# Patient Record
Sex: Male | Born: 1945 | Race: White | Hispanic: No | Marital: Married | Smoking: Never smoker
Health system: Southern US, Community
[De-identification: ages and names within clinical notes are randomized; demographics above are authoritative.]

## PROBLEM LIST (undated history)

## (undated) DIAGNOSIS — C801 Malignant (primary) neoplasm, unspecified: Secondary | ICD-10-CM

## (undated) DIAGNOSIS — E7800 Pure hypercholesterolemia, unspecified: Secondary | ICD-10-CM

## (undated) DIAGNOSIS — K219 Gastro-esophageal reflux disease without esophagitis: Secondary | ICD-10-CM

## (undated) DIAGNOSIS — I10 Essential (primary) hypertension: Secondary | ICD-10-CM

## (undated) DIAGNOSIS — I2510 Atherosclerotic heart disease of native coronary artery without angina pectoris: Secondary | ICD-10-CM

## (undated) HISTORY — PX: CORONARY ANGIOPLASTY WITH STENT PLACEMENT: SHX49

## (undated) HISTORY — PX: HERNIA REPAIR: SHX51

---

## 2012-12-07 ENCOUNTER — Emergency Department (HOSPITAL_BASED_OUTPATIENT_CLINIC_OR_DEPARTMENT_OTHER): Payer: Medicare Other

## 2012-12-07 ENCOUNTER — Emergency Department (HOSPITAL_BASED_OUTPATIENT_CLINIC_OR_DEPARTMENT_OTHER)
Admission: EM | Admit: 2012-12-07 | Discharge: 2012-12-07 | Disposition: A | Discharge status: Home / Self Care | Payer: Medicare Other | Attending: Emergency Medicine | Admitting: Emergency Medicine

## 2012-12-07 ENCOUNTER — Encounter (HOSPITAL_BASED_OUTPATIENT_CLINIC_OR_DEPARTMENT_OTHER): Payer: Self-pay | Admitting: Emergency Medicine

## 2012-12-07 DIAGNOSIS — S93609A Unspecified sprain of unspecified foot, initial encounter: Secondary | ICD-10-CM | POA: Insufficient documentation

## 2012-12-07 DIAGNOSIS — K219 Gastro-esophageal reflux disease without esophagitis: Secondary | ICD-10-CM | POA: Insufficient documentation

## 2012-12-07 DIAGNOSIS — S93602A Unspecified sprain of left foot, initial encounter: Secondary | ICD-10-CM

## 2012-12-07 DIAGNOSIS — Y929 Unspecified place or not applicable: Secondary | ICD-10-CM | POA: Insufficient documentation

## 2012-12-07 DIAGNOSIS — I10 Essential (primary) hypertension: Secondary | ICD-10-CM | POA: Insufficient documentation

## 2012-12-07 DIAGNOSIS — X500XXA Overexertion from strenuous movement or load, initial encounter: Secondary | ICD-10-CM | POA: Insufficient documentation

## 2012-12-07 DIAGNOSIS — Z79899 Other long term (current) drug therapy: Secondary | ICD-10-CM | POA: Insufficient documentation

## 2012-12-07 DIAGNOSIS — Z7982 Long term (current) use of aspirin: Secondary | ICD-10-CM | POA: Insufficient documentation

## 2012-12-07 DIAGNOSIS — Y9301 Activity, walking, marching and hiking: Secondary | ICD-10-CM | POA: Insufficient documentation

## 2012-12-07 DIAGNOSIS — Z859 Personal history of malignant neoplasm, unspecified: Secondary | ICD-10-CM | POA: Insufficient documentation

## 2012-12-07 DIAGNOSIS — I2510 Atherosclerotic heart disease of native coronary artery without angina pectoris: Secondary | ICD-10-CM | POA: Insufficient documentation

## 2012-12-07 DIAGNOSIS — Z9861 Coronary angioplasty status: Secondary | ICD-10-CM | POA: Insufficient documentation

## 2012-12-07 DIAGNOSIS — E7800 Pure hypercholesterolemia, unspecified: Secondary | ICD-10-CM | POA: Insufficient documentation

## 2012-12-07 HISTORY — DX: Malignant (primary) neoplasm, unspecified: C80.1

## 2012-12-07 HISTORY — DX: Pure hypercholesterolemia, unspecified: E78.00

## 2012-12-07 HISTORY — DX: Atherosclerotic heart disease of native coronary artery without angina pectoris: I25.10

## 2012-12-07 HISTORY — DX: Essential (primary) hypertension: I10

## 2012-12-07 HISTORY — DX: Gastro-esophageal reflux disease without esophagitis: K21.9

## 2012-12-07 NOTE — ED Provider Notes (Signed)
CSN: [id number]     Arrival date & time 12/07/12  1457 History   First MD Initiated Contact with Patient 12/07/12 1606     Chief Complaint  Patient presents with  . Foot Injury    HPI Patient presents to emergency room with complaints of left foot pain. He was walking yesterday and stepped on something awkwardly. he had mild discomfort initially but over the last day he has had increasing pain.  The pain is mild to moderate. She tried taking an anti-inflammatory medication with some relief. The pain is better when he is resting. He has not noticed any swelling or bruising. He denies any other injuries or complaints. Past Medical History  Diagnosis Date  . High cholesterol   . GERD (gastroesophageal reflux disease)   . Hypertension   . Coronary artery disease   . Cancer    Past Surgical History  Procedure Laterality Date  . Coronary angioplasty with stent placement    . Hernia repair     No family history on file. History  Substance Use Topics  . Smoking status: Never Smoker   . Smokeless tobacco: Not on file  . Alcohol Use: No    Review of Systems  Constitutional: Negative for fever.  Musculoskeletal: Negative for joint swelling and myalgias.  Neurological: Negative for numbness.    Allergies  Review of patient's allergies indicates no known allergies.  Home Medications   Current Outpatient Rx  Name  Route  Sig  Dispense  Refill  . aspirin 81 MG tablet   Oral   Take 81 mg by mouth daily.         . Metoprolol Tartrate (LOPRESSOR PO)   Oral   Take by mouth.         . nitroGLYCERIN (NITROSTAT) 0.3 MG SL tablet   Sublingual   Place 0.3 mg under the tongue every 5 (five) minutes as needed for chest pain.         Marland Kitchen Omeprazole (PRILOSEC PO)   Oral   Take by mouth.         . Rosuvastatin Calcium (CRESTOR PO)   Oral   Take by mouth.          BP 106/62  Pulse 66  Temp(Src) 98.2 F (36.8 C) (Oral)  Resp 16  Ht 5\' 10"  (1.778 m)  Wt 190 lb (86.183  kg)  BMI 27.26 kg/m2  SpO2 100% Physical Exam  Nursing note and vitals reviewed. Constitutional: He appears well-developed and well-nourished. No distress.  HENT:  Head: Normocephalic and atraumatic.  Right Ear: External ear normal.  Left Ear: External ear normal.  Eyes: Conjunctivae are normal. Right eye exhibits no discharge. Left eye exhibits no discharge. No scleral icterus.  Neck: Neck supple. No tracheal deviation present.  Cardiovascular: Normal rate.   Pulmonary/Chest: Effort normal. No stridor. No respiratory distress.  Musculoskeletal: He exhibits no edema.       Left ankle: Normal. No tenderness.       Left foot: He exhibits bony tenderness. He exhibits no swelling, no crepitus and no deformity.   tenderness palpation left fifth metatarsal region, no edema, no ecchymoses, neurovascular intact  Neurological: He is alert. Cranial nerve deficit: no gross deficits.  Skin: Skin is warm and dry. No rash noted.  Psychiatric: He has a normal mood and affect.    ED Course  Procedures (including critical care time) Labs Review Labs Reviewed - No data to display Imaging Review Dg Foot Complete Left  12/07/2012   CLINICAL DATA:  Twisted foot. Pain along lateral foot near the base of 5th metatarsal.  EXAM: LEFT FOOT - COMPLETE 3+ VIEW  COMPARISON:  None.  FINDINGS: There is no evidence of fracture or dislocation. There is joint space loss and spurring along the lateral side of the 1st metatarsophalangeal joint. Peripheral vascular calcifications are noted anterior to the ankle joint. Soft tissues are unremarkable.  IMPRESSION: No acute bony abnormality.   Electronically Signed   By: Britta Mccreedy M.D.   On: 12/07/2012 15:46    EKG Interpretation   None       MDM   1. Foot sprain, left, initial encounter    Most likely a mild foot sprain. Rest ice elevate. Follow up with his primary Dr. as needed. Patient did not feel that he needed any crutches    Celene Kras,  MD 12/07/12 [id number]

## 2012-12-07 NOTE — ED Notes (Signed)
Left foot pain while walking yesterday.

## 2015-02-02 DIAGNOSIS — J029 Acute pharyngitis, unspecified: Secondary | ICD-10-CM | POA: Diagnosis not present

## 2015-02-02 DIAGNOSIS — R0982 Postnasal drip: Secondary | ICD-10-CM | POA: Diagnosis not present

## 2015-02-02 DIAGNOSIS — J0140 Acute pansinusitis, unspecified: Secondary | ICD-10-CM | POA: Diagnosis not present

## 2015-02-06 DIAGNOSIS — G4733 Obstructive sleep apnea (adult) (pediatric): Secondary | ICD-10-CM | POA: Diagnosis not present

## 2015-03-12 DIAGNOSIS — J0140 Acute pansinusitis, unspecified: Secondary | ICD-10-CM | POA: Diagnosis not present

## 2015-03-22 DIAGNOSIS — I2510 Atherosclerotic heart disease of native coronary artery without angina pectoris: Secondary | ICD-10-CM | POA: Diagnosis not present

## 2015-03-22 DIAGNOSIS — Z9861 Coronary angioplasty status: Secondary | ICD-10-CM | POA: Diagnosis not present

## 2015-03-22 DIAGNOSIS — R0609 Other forms of dyspnea: Secondary | ICD-10-CM | POA: Diagnosis not present

## 2015-03-22 DIAGNOSIS — R0789 Other chest pain: Secondary | ICD-10-CM | POA: Diagnosis not present

## 2015-03-22 DIAGNOSIS — E785 Hyperlipidemia, unspecified: Secondary | ICD-10-CM | POA: Diagnosis not present

## 2015-03-26 DIAGNOSIS — Z01818 Encounter for other preprocedural examination: Secondary | ICD-10-CM | POA: Diagnosis not present

## 2015-03-28 DIAGNOSIS — I25118 Atherosclerotic heart disease of native coronary artery with other forms of angina pectoris: Secondary | ICD-10-CM | POA: Diagnosis not present

## 2015-03-28 DIAGNOSIS — I25119 Atherosclerotic heart disease of native coronary artery with unspecified angina pectoris: Secondary | ICD-10-CM | POA: Diagnosis not present

## 2015-03-28 DIAGNOSIS — E785 Hyperlipidemia, unspecified: Secondary | ICD-10-CM | POA: Diagnosis not present

## 2015-03-28 DIAGNOSIS — R0602 Shortness of breath: Secondary | ICD-10-CM | POA: Diagnosis not present

## 2015-03-28 DIAGNOSIS — Z9861 Coronary angioplasty status: Secondary | ICD-10-CM | POA: Diagnosis not present

## 2015-03-28 DIAGNOSIS — R0789 Other chest pain: Secondary | ICD-10-CM | POA: Diagnosis not present

## 2015-03-28 DIAGNOSIS — Z79899 Other long term (current) drug therapy: Secondary | ICD-10-CM | POA: Diagnosis not present

## 2015-03-28 DIAGNOSIS — I351 Nonrheumatic aortic (valve) insufficiency: Secondary | ICD-10-CM | POA: Diagnosis not present

## 2015-03-28 DIAGNOSIS — R0609 Other forms of dyspnea: Secondary | ICD-10-CM | POA: Diagnosis not present

## 2015-03-28 DIAGNOSIS — Q251 Coarctation of aorta: Secondary | ICD-10-CM | POA: Diagnosis not present

## 2015-04-26 DIAGNOSIS — E785 Hyperlipidemia, unspecified: Secondary | ICD-10-CM | POA: Diagnosis not present

## 2015-04-26 DIAGNOSIS — I2510 Atherosclerotic heart disease of native coronary artery without angina pectoris: Secondary | ICD-10-CM | POA: Diagnosis not present

## 2015-04-26 DIAGNOSIS — R0609 Other forms of dyspnea: Secondary | ICD-10-CM | POA: Diagnosis not present

## 2015-07-11 DIAGNOSIS — J0140 Acute pansinusitis, unspecified: Secondary | ICD-10-CM | POA: Diagnosis not present

## 2015-09-26 DIAGNOSIS — L570 Actinic keratosis: Secondary | ICD-10-CM | POA: Diagnosis not present

## 2015-09-26 DIAGNOSIS — L820 Inflamed seborrheic keratosis: Secondary | ICD-10-CM | POA: Diagnosis not present

## 2015-10-22 DIAGNOSIS — Z23 Encounter for immunization: Secondary | ICD-10-CM | POA: Diagnosis not present

## 2015-12-04 DIAGNOSIS — Z7982 Long term (current) use of aspirin: Secondary | ICD-10-CM | POA: Diagnosis not present

## 2015-12-04 DIAGNOSIS — C9241 Acute promyelocytic leukemia, in remission: Secondary | ICD-10-CM | POA: Diagnosis not present

## 2015-12-04 DIAGNOSIS — K219 Gastro-esophageal reflux disease without esophagitis: Secondary | ICD-10-CM | POA: Diagnosis not present

## 2015-12-04 DIAGNOSIS — E785 Hyperlipidemia, unspecified: Secondary | ICD-10-CM | POA: Diagnosis not present

## 2015-12-04 DIAGNOSIS — I2510 Atherosclerotic heart disease of native coronary artery without angina pectoris: Secondary | ICD-10-CM | POA: Diagnosis not present

## 2015-12-04 DIAGNOSIS — N400 Enlarged prostate without lower urinary tract symptoms: Secondary | ICD-10-CM | POA: Diagnosis not present

## 2015-12-04 DIAGNOSIS — Z79899 Other long term (current) drug therapy: Secondary | ICD-10-CM | POA: Diagnosis not present

## 2015-12-06 DIAGNOSIS — N400 Enlarged prostate without lower urinary tract symptoms: Secondary | ICD-10-CM | POA: Diagnosis not present

## 2015-12-13 DIAGNOSIS — N401 Enlarged prostate with lower urinary tract symptoms: Secondary | ICD-10-CM | POA: Diagnosis not present

## 2015-12-13 DIAGNOSIS — N138 Other obstructive and reflux uropathy: Secondary | ICD-10-CM | POA: Diagnosis not present

## 2015-12-13 DIAGNOSIS — R972 Elevated prostate specific antigen [PSA]: Secondary | ICD-10-CM | POA: Diagnosis not present

## 2016-01-11 DIAGNOSIS — M10062 Idiopathic gout, left knee: Secondary | ICD-10-CM | POA: Diagnosis not present

## 2016-03-03 DIAGNOSIS — M19079 Primary osteoarthritis, unspecified ankle and foot: Secondary | ICD-10-CM | POA: Diagnosis not present

## 2016-03-03 DIAGNOSIS — M7121 Synovial cyst of popliteal space [Baker], right knee: Secondary | ICD-10-CM | POA: Diagnosis not present

## 2016-03-03 DIAGNOSIS — M150 Primary generalized (osteo)arthritis: Secondary | ICD-10-CM | POA: Diagnosis not present

## 2016-03-04 DIAGNOSIS — M25562 Pain in left knee: Secondary | ICD-10-CM | POA: Diagnosis not present

## 2016-03-04 DIAGNOSIS — M25561 Pain in right knee: Secondary | ICD-10-CM | POA: Diagnosis not present

## 2016-04-02 DIAGNOSIS — N400 Enlarged prostate without lower urinary tract symptoms: Secondary | ICD-10-CM | POA: Diagnosis not present

## 2016-04-09 DIAGNOSIS — R972 Elevated prostate specific antigen [PSA]: Secondary | ICD-10-CM | POA: Diagnosis not present

## 2016-07-23 DIAGNOSIS — E785 Hyperlipidemia, unspecified: Secondary | ICD-10-CM | POA: Diagnosis not present

## 2016-07-31 DIAGNOSIS — E785 Hyperlipidemia, unspecified: Secondary | ICD-10-CM | POA: Diagnosis not present

## 2016-07-31 DIAGNOSIS — Z9861 Coronary angioplasty status: Secondary | ICD-10-CM | POA: Diagnosis not present

## 2016-07-31 DIAGNOSIS — I2510 Atherosclerotic heart disease of native coronary artery without angina pectoris: Secondary | ICD-10-CM | POA: Diagnosis not present

## 2016-08-13 DIAGNOSIS — K429 Umbilical hernia without obstruction or gangrene: Secondary | ICD-10-CM | POA: Diagnosis not present

## 2016-08-13 DIAGNOSIS — R197 Diarrhea, unspecified: Secondary | ICD-10-CM | POA: Diagnosis not present

## 2016-08-13 DIAGNOSIS — K909 Intestinal malabsorption, unspecified: Secondary | ICD-10-CM | POA: Diagnosis not present

## 2016-08-21 DIAGNOSIS — R972 Elevated prostate specific antigen [PSA]: Secondary | ICD-10-CM | POA: Diagnosis not present

## 2016-09-01 DIAGNOSIS — K429 Umbilical hernia without obstruction or gangrene: Secondary | ICD-10-CM | POA: Diagnosis not present

## 2016-09-08 DIAGNOSIS — K219 Gastro-esophageal reflux disease without esophagitis: Secondary | ICD-10-CM | POA: Diagnosis not present

## 2016-09-08 DIAGNOSIS — Z86010 Personal history of colonic polyps: Secondary | ICD-10-CM | POA: Diagnosis not present

## 2016-09-08 DIAGNOSIS — R197 Diarrhea, unspecified: Secondary | ICD-10-CM | POA: Diagnosis not present

## 2016-09-09 DIAGNOSIS — R3129 Other microscopic hematuria: Secondary | ICD-10-CM | POA: Diagnosis not present

## 2016-09-09 DIAGNOSIS — R972 Elevated prostate specific antigen [PSA]: Secondary | ICD-10-CM | POA: Diagnosis not present

## 2016-09-09 DIAGNOSIS — R197 Diarrhea, unspecified: Secondary | ICD-10-CM | POA: Diagnosis not present

## 2016-10-08 DIAGNOSIS — K5730 Diverticulosis of large intestine without perforation or abscess without bleeding: Secondary | ICD-10-CM | POA: Diagnosis not present

## 2016-10-08 DIAGNOSIS — K317 Polyp of stomach and duodenum: Secondary | ICD-10-CM | POA: Diagnosis not present

## 2016-10-08 DIAGNOSIS — D123 Benign neoplasm of transverse colon: Secondary | ICD-10-CM | POA: Diagnosis not present

## 2016-10-08 DIAGNOSIS — K641 Second degree hemorrhoids: Secondary | ICD-10-CM | POA: Diagnosis not present

## 2016-10-08 DIAGNOSIS — R197 Diarrhea, unspecified: Secondary | ICD-10-CM | POA: Diagnosis not present

## 2016-10-08 DIAGNOSIS — K209 Esophagitis, unspecified: Secondary | ICD-10-CM | POA: Diagnosis not present

## 2016-10-08 DIAGNOSIS — D122 Benign neoplasm of ascending colon: Secondary | ICD-10-CM | POA: Diagnosis not present

## 2016-10-08 DIAGNOSIS — Z86010 Personal history of colonic polyps: Secondary | ICD-10-CM | POA: Diagnosis not present

## 2016-11-17 DIAGNOSIS — C9241 Acute promyelocytic leukemia, in remission: Secondary | ICD-10-CM | POA: Diagnosis not present

## 2016-11-17 DIAGNOSIS — K429 Umbilical hernia without obstruction or gangrene: Secondary | ICD-10-CM | POA: Diagnosis not present

## 2016-11-27 DIAGNOSIS — J0110 Acute frontal sinusitis, unspecified: Secondary | ICD-10-CM | POA: Diagnosis not present

## 2016-12-02 ENCOUNTER — Emergency Department (HOSPITAL_BASED_OUTPATIENT_CLINIC_OR_DEPARTMENT_OTHER)
Admission: EM | Admit: 2016-12-02 | Discharge: 2016-12-02 | Disposition: A | Discharge status: Home / Self Care | Payer: PPO | Attending: Emergency Medicine | Admitting: Emergency Medicine

## 2016-12-02 ENCOUNTER — Other Ambulatory Visit: Payer: Self-pay

## 2016-12-02 ENCOUNTER — Encounter (HOSPITAL_BASED_OUTPATIENT_CLINIC_OR_DEPARTMENT_OTHER): Payer: Self-pay | Admitting: Emergency Medicine

## 2016-12-02 ENCOUNTER — Emergency Department (HOSPITAL_BASED_OUTPATIENT_CLINIC_OR_DEPARTMENT_OTHER): Payer: PPO

## 2016-12-02 DIAGNOSIS — Z79899 Other long term (current) drug therapy: Secondary | ICD-10-CM | POA: Diagnosis not present

## 2016-12-02 DIAGNOSIS — I2510 Atherosclerotic heart disease of native coronary artery without angina pectoris: Secondary | ICD-10-CM | POA: Diagnosis not present

## 2016-12-02 DIAGNOSIS — R1030 Lower abdominal pain, unspecified: Secondary | ICD-10-CM | POA: Diagnosis not present

## 2016-12-02 DIAGNOSIS — I10 Essential (primary) hypertension: Secondary | ICD-10-CM | POA: Insufficient documentation

## 2016-12-02 DIAGNOSIS — Z7982 Long term (current) use of aspirin: Secondary | ICD-10-CM | POA: Insufficient documentation

## 2016-12-02 DIAGNOSIS — K7689 Other specified diseases of liver: Secondary | ICD-10-CM | POA: Diagnosis not present

## 2016-12-02 DIAGNOSIS — R109 Unspecified abdominal pain: Secondary | ICD-10-CM | POA: Diagnosis not present

## 2016-12-02 LAB — URINALYSIS, ROUTINE W REFLEX MICROSCOPIC
Bilirubin Urine: NEGATIVE
GLUCOSE, UA: NEGATIVE mg/dL
KETONES UR: NEGATIVE mg/dL
Leukocytes, UA: NEGATIVE
Nitrite: NEGATIVE
PH: 6 (ref 5.0–8.0)
Protein, ur: NEGATIVE mg/dL
Specific Gravity, Urine: 1.01 (ref 1.005–1.030)

## 2016-12-02 LAB — CBC
HEMATOCRIT: 43.1 % (ref 39.0–52.0)
Hemoglobin: 14.7 g/dL (ref 13.0–17.0)
MCH: 30.2 pg (ref 26.0–34.0)
MCHC: 34.1 g/dL (ref 30.0–36.0)
MCV: 88.5 fL (ref 78.0–100.0)
PLATELETS: 217 10*3/uL (ref 150–400)
RBC: 4.87 MIL/uL (ref 4.22–5.81)
RDW: 14.3 % (ref 11.5–15.5)
WBC: 7.7 10*3/uL (ref 4.0–10.5)

## 2016-12-02 LAB — COMPREHENSIVE METABOLIC PANEL
ALBUMIN: 3.9 g/dL (ref 3.5–5.0)
ALT: 22 U/L (ref 17–63)
AST: 25 U/L (ref 15–41)
Alkaline Phosphatase: 40 U/L (ref 38–126)
Anion gap: 6 (ref 5–15)
BUN: 15 mg/dL (ref 6–20)
CO2: 26 mmol/L (ref 22–32)
CREATININE: 1.04 mg/dL (ref 0.61–1.24)
Calcium: 8.8 mg/dL — ABNORMAL LOW (ref 8.9–10.3)
Chloride: 104 mmol/L (ref 101–111)
GFR calc Af Amer: >60 mL/min (ref >60)
GLUCOSE: 88 mg/dL (ref 65–99)
POTASSIUM: 3.7 mmol/L (ref 3.5–5.1)
Sodium: 136 mmol/L (ref 135–145)
Total Bilirubin: 0.5 mg/dL (ref 0.3–1.2)
Total Protein: 7.1 g/dL (ref 6.5–8.1)

## 2016-12-02 LAB — URINALYSIS, MICROSCOPIC (REFLEX)

## 2016-12-02 LAB — LIPASE, BLOOD: Lipase: 19 U/L (ref 11–51)

## 2016-12-02 MED ORDER — IOPAMIDOL (ISOVUE-300) INJECTION 61%
100.0000 mL | Freq: Once | INTRAVENOUS | Status: AC | PRN
  Administered 2016-12-02: 100 mL via INTRAVENOUS

## 2016-12-02 NOTE — Discharge Instructions (Signed)
Your imaging and lab work has been reassuring.  No acute process is noted for your pain today.  Have discussed with you the finding of the cyst on your liver that needs outpatient follow-up with your PCP.  No signs of infection.  Feel that you are able to have surgery on Thursday.  Please discuss findings with your surgeon.  Motrin and Tylenol for pain.  Follow-up with PCP or return to the ED with any worsening or new symptoms.

## 2016-12-02 NOTE — ED Notes (Signed)
He is scheduled to have a hernia repaired in 2 days. umbilical hernia.

## 2016-12-02 NOTE — ED Provider Notes (Signed)
[facility] EMERGENCY DEPARTMENT Provider Note   CSN: [id number] Arrival date & time: 12/02/16  1530     History   Chief Complaint Chief Complaint  Patient presents with  . Abdominal Pain    HPI Billy Ponce is a 71 y.o. male.  HPI 71 year old Caucasian male past medical history significant for CAD, hypertension the presents to the ED for evaluation of lower abdominal pain.  The patient states that he woke up this morning with some suprapubic abdominal pain and discomfort.  Patient describes it as soreness.  States he also felt like the muscles in his groin were weak like he just exercised.  Patient took some arthritis Tylenol medicine and states this resolved his symptoms.  Patient denies any associated urinary symptoms, change in bowel habits, fever, nausea, emesis.  No history of same.  Patient is having a ventral hernia repaired in 3 days.  He did speak with his general surgeon's office today who recommended patient come to the ED for evaluation to make sure there is no intra-abdominal pathology.  Patient denies pain at this time.  States the pain is intermittent.  Nothing makes better or worse.  Pain does not radiate.  Denies any associated flank pain, testicular pain or swelling.  Patient states he is followed by urology for PSA testing and urine testing given that he has a history of RBCs in his urine.  Patient last colonoscopy was this year with several polyps removed but no findings of a colon cancer at this time.  Pt denies any fever, chill, ha, vision changes, lightheadedness, dizziness, congestion, neck pain, cp, sob, cough,, n/v/d, urinary symptoms, change in bowel habits, melena, hematochezia, lower extremity paresthesias.  Past Medical History:  Diagnosis Date  . Cancer ([place])   . Coronary artery disease   . GERD (gastroesophageal reflux disease)   . High cholesterol   . Hypertension     There are no active problems to display for this patient.   Past  Surgical History:  Procedure Laterality Date  . CORONARY ANGIOPLASTY WITH STENT PLACEMENT    . HERNIA REPAIR         Home Medications    Prior to Admission medications   Medication Sig Start Date End Date Taking? Authorizing Provider  aspirin 81 MG tablet Take 81 mg by mouth daily.   Yes [provider]  ezetimibe (ZETIA) 10 MG tablet Take 10 mg daily by mouth.   Yes [provider]  Omeprazole (PRILOSEC PO) Take by mouth.   Yes [provider]  Rosuvastatin Calcium (CRESTOR PO) Take by mouth.   Yes [provider]  Metoprolol Tartrate (LOPRESSOR PO) Take by mouth.    [provider]  nitroGLYCERIN (NITROSTAT) 0.3 MG SL tablet Place 0.3 mg under the tongue every 5 (five) minutes as needed for chest pain.    [provider]    Family History No family history on file.  Social History Social History   Tobacco Use  . Smoking status: Never Smoker  . Smokeless tobacco: Never Used  Substance Use Topics  . Alcohol use: No  . Drug use: No     Allergies   Patient has no known allergies.   Review of Systems Review of Systems  Constitutional: Negative for chills and fever.  Respiratory: Negative for shortness of breath.   Cardiovascular: Negative for chest pain.  Gastrointestinal: Positive for abdominal pain. Negative for blood in stool, constipation, diarrhea, nausea and vomiting.  Genitourinary: Negative for decreased  urine volume, dysuria, flank pain, frequency, hematuria, scrotal swelling and urgency.  Musculoskeletal: Negative for back pain.  Skin: Negative for color change and rash.  Neurological: Negative for dizziness, light-headedness and headaches.     Physical Exam Updated Vital Signs BP (!) 151/76 (BP Location: Left Arm)   Pulse 61   Temp 98.1 F (36.7 C) (Oral)   Resp 16   SpO2 99%   Physical Exam  Constitutional: He is oriented to person, place, and time. He appears well-developed and  well-nourished.  Non-toxic appearance. No distress.  HENT:  Head: Normocephalic and atraumatic.  Mouth/Throat: Oropharynx is clear and moist.  Eyes: Conjunctivae are normal. Pupils are equal, round, and reactive to light. Right eye exhibits no discharge. Left eye exhibits no discharge.  Neck: Normal range of motion. Neck supple.  Cardiovascular: Normal rate, regular rhythm, normal heart sounds and intact distal pulses. Exam reveals no gallop and no friction rub.  No murmur heard. Pulmonary/Chest: Effort normal and breath sounds normal. No stridor. No respiratory distress. He has no wheezes. He has no rales. He exhibits no tenderness.  Abdominal: Soft. Bowel sounds are normal. He exhibits no distension. There is no hepatosplenomegaly. There is no tenderness. There is no rigidity, no rebound, no guarding, no CVA tenderness, no tenderness at McBurney's point and negative Murphy's sign.  No ventral hernia appreciated.  Musculoskeletal: Normal range of motion. He exhibits no tenderness.  Lymphadenopathy:    He has no cervical adenopathy.  Neurological: He is alert and oriented to person, place, and time.  Skin: Skin is warm and dry. Capillary refill takes less than 2 seconds. No rash noted.  Psychiatric: His behavior is normal. Judgment and thought content normal.  Nursing note and vitals reviewed.    ED Treatments / Results  Labs (all labs ordered are listed, but only abnormal results are displayed) Labs Reviewed  COMPREHENSIVE METABOLIC PANEL - Abnormal; Notable for the following components:      Result Value   Calcium 8.8 (*)    All other components within normal limits  URINALYSIS, ROUTINE W REFLEX MICROSCOPIC - Abnormal; Notable for the following components:   Hgb urine dipstick SMALL (*)    All other components within normal limits  URINALYSIS, MICROSCOPIC (REFLEX) - Abnormal; Notable for the following components:   Bacteria, UA RARE (*)    Squamous Epithelial / LPF 0-5 (*)    All  other components within normal limits  LIPASE, BLOOD  CBC    EKG  EKG Interpretation None       Radiology Ct Abdomen Pelvis W Contrast  Result Date: 12/02/2016 CLINICAL DATA:  71 y/o M; lower abdominal pain and groin pain since yesterday. History of leukemia, GERD, hernia repair, hypertension. EXAM: CT ABDOMEN AND PELVIS WITH CONTRAST TECHNIQUE: Multidetector CT imaging of the abdomen and pelvis was performed using the standard protocol following bolus administration of intravenous contrast. CONTRAST:  199mL ISOVUE-300 IOPAMIDOL (ISOVUE-300) INJECTION 61% COMPARISON:  2009 CT abdomen and pelvis report FINDINGS: Lower chest: No acute abnormality. Hepatobiliary: 18 mm low-attenuation lesion within segment 4B of the liver. No other focal liver lesion. Normal gallbladder. No biliary ductal dilatation. Pancreas: Unremarkable. No pancreatic ductal dilatation or surrounding inflammatory changes. Spleen: Normal in size without focal abnormality. Adrenals/Urinary Tract: Normal adrenal glands. Multiple renal cysts measuring up to 17 mm in the left lower pole. No hydronephrosis. Normal bladder. Stomach/Bowel: Stomach is within normal limits. Appendix not identified, no pericecal inflammatory changes. No evidence of bowel wall thickening, distention, or inflammatory  changes. Vascular/Lymphatic: Aortic atherosclerosis. No enlarged abdominal or pelvic lymph nodes. Reproductive: Mild prostate enlargement. Other: No recurrent inguinal hernia.  No ascites. Musculoskeletal: Mild lumbar dextrocurvature with apex at L4. Multilevel discogenic degenerative changes of the spine and lower lumbar facet arthrosis. No acute fracture. IMPRESSION: 1. Liver segment 4B 17 mm mass with similar lesion described on 2009 CT of abdomen and pelvis report, likely benign. This can be further characterized with a MRI of the abdomen with and without contrast. 2. No recurrent hernia identified.  No acute process. 3. Renal cysts. 4. Aortic  atherosclerosis. 5. Mild prostate enlargement. 6. Moderate multilevel degenerative changes of the visible spine. Electronically Signed   By: Kristine Garbe M.D.   On: 12/02/2016 18:58    Procedures Procedures (including critical care time)  Medications Ordered in ED Medications  iopamidol (ISOVUE-300) 61 % injection 100 mL (100 mLs Intravenous Contrast Given 12/02/16 1825)     Initial Impression / Assessment and Plan / ED Course  I have reviewed the triage vital signs and the nursing notes.  Pertinent labs & imaging results that were available during my care of the patient were reviewed by me and considered in my medical decision making (see chart for details).     Patient presents to the ED with complaints of lower abdominal discomfort that started this morning.  Denies any associated urinary symptoms, change in bowel habits, fever, nausea, emesis.  Relieved by Tylenol.  Patient is having a hernia repaired in 2 days and general surgeon sent to the ED for evaluation.  On exam patient is overall well-appearing and nontoxic.  Vital signs are reassuring.  Patient has no focal abdominal tenderness to palpation.  No CVA tenderness.  Lungs clear to auscultation bilaterally.  Patient's lab work is reassuring.  No leukocytosis.  Electrolyte start baseline.  Normal kidney function.  Lipase is normal.  UA with few RBCs, rare bacteria and squamous epithelium cells.  No WBCs or nitrites.   CT scan was performed to rule out any intra-abdominal pathology.  This shows chronic findings but no acute findings noted.  Patient informed of the liver lesion and encouraged to have outpatient follow-up with MRI.  Mildly enlarged prostate however low suspicion for prostatitis given normal urine.  Patient is followed by urology and has appointment them next week.  Patient is currently on ciprofloxacin for upper respiratory tract infection.  Repeat abdominal exam is benign.  No signs of peritonitis.   Patient remains overall well-appearing.  Vital signs remained reassuring.  Pt is hemodynamically stable, in NAD, & able to ambulate in the ED. Evaluation does not show pathology that would require ongoing emergent intervention or inpatient treatment. I explained the diagnosis to the patient. Pain has been managed & has no complaints prior to dc. Pt is comfortable with above plan and is stable for discharge at this time. All questions were answered prior to disposition. Strict return precautions for f/u to the ED were discussed. Encouraged follow up with PCP.  Pt seen by and dicussed with Dr. Laverta Baltimore who is agreeale to the above plna.  Final Clinical Impressions(s) / ED Diagnoses   Final diagnoses:  Lower abdominal pain  Liver cyst    ED Discharge Orders    None       Aaron Edelman 12/02/16 1929    Margette Fast, MD 12/03/16 1248

## 2016-12-02 NOTE — ED Triage Notes (Signed)
Pt c/o lower abd pain and groin pain since yesterday. Denies urinary symptoms. Pt is scheduled for hernia surgery this week and was told to come for eval.

## 2016-12-04 DIAGNOSIS — K668 Other specified disorders of peritoneum: Secondary | ICD-10-CM | POA: Diagnosis not present

## 2016-12-04 DIAGNOSIS — K439 Ventral hernia without obstruction or gangrene: Secondary | ICD-10-CM | POA: Diagnosis not present

## 2016-12-04 DIAGNOSIS — K429 Umbilical hernia without obstruction or gangrene: Secondary | ICD-10-CM | POA: Diagnosis not present

## 2016-12-04 DIAGNOSIS — K654 Sclerosing mesenteritis: Secondary | ICD-10-CM | POA: Diagnosis not present

## 2016-12-04 DIAGNOSIS — K4090 Unilateral inguinal hernia, without obstruction or gangrene, not specified as recurrent: Secondary | ICD-10-CM | POA: Diagnosis not present

## 2016-12-04 DIAGNOSIS — K420 Umbilical hernia with obstruction, without gangrene: Secondary | ICD-10-CM | POA: Diagnosis not present

## 2016-12-17 DIAGNOSIS — R972 Elevated prostate specific antigen [PSA]: Secondary | ICD-10-CM | POA: Diagnosis not present

## 2016-12-26 DIAGNOSIS — R972 Elevated prostate specific antigen [PSA]: Secondary | ICD-10-CM | POA: Diagnosis not present

## 2019-06-03 ENCOUNTER — Emergency Department (HOSPITAL_BASED_OUTPATIENT_CLINIC_OR_DEPARTMENT_OTHER)
Admission: EM | Admit: 2019-06-03 | Discharge: 2019-06-03 | Disposition: A | Discharge status: Home / Self Care | Payer: Medicare HMO | Attending: Emergency Medicine | Admitting: Emergency Medicine

## 2019-06-03 ENCOUNTER — Emergency Department (HOSPITAL_BASED_OUTPATIENT_CLINIC_OR_DEPARTMENT_OTHER): Payer: Medicare HMO

## 2019-06-03 ENCOUNTER — Encounter (HOSPITAL_BASED_OUTPATIENT_CLINIC_OR_DEPARTMENT_OTHER): Payer: Self-pay

## 2019-06-03 ENCOUNTER — Other Ambulatory Visit: Payer: Self-pay

## 2019-06-03 DIAGNOSIS — M25552 Pain in left hip: Secondary | ICD-10-CM | POA: Insufficient documentation

## 2019-06-03 DIAGNOSIS — I259 Chronic ischemic heart disease, unspecified: Secondary | ICD-10-CM | POA: Diagnosis not present

## 2019-06-03 DIAGNOSIS — Z7982 Long term (current) use of aspirin: Secondary | ICD-10-CM | POA: Insufficient documentation

## 2019-06-03 DIAGNOSIS — I10 Essential (primary) hypertension: Secondary | ICD-10-CM | POA: Insufficient documentation

## 2019-06-03 DIAGNOSIS — W19XXXA Unspecified fall, initial encounter: Secondary | ICD-10-CM

## 2019-06-03 DIAGNOSIS — W07XXXA Fall from chair, initial encounter: Secondary | ICD-10-CM | POA: Diagnosis not present

## 2019-06-03 DIAGNOSIS — Z79899 Other long term (current) drug therapy: Secondary | ICD-10-CM | POA: Diagnosis not present

## 2019-06-03 DIAGNOSIS — M25512 Pain in left shoulder: Secondary | ICD-10-CM | POA: Diagnosis not present

## 2019-06-03 DIAGNOSIS — M25532 Pain in left wrist: Secondary | ICD-10-CM | POA: Diagnosis not present

## 2019-06-03 NOTE — Discharge Instructions (Addendum)
Your x-rays were negative for any fracture today.   Please continue to do physical therapy.  Please use Tylenol 1000 mg every 6 hours as needed for pain.  Please follow-up with your orthopedic doctor as needed for continued symptoms as you may benefit from additional evaluation/consideration for steroid injections.

## 2019-06-03 NOTE — ED Triage Notes (Signed)
Pt fell out of office chair this am-pain left shoulder, hand and hip-denies head/neck pain-NAD-steady gait

## 2019-06-03 NOTE — ED Provider Notes (Signed)
[facility] EMERGENCY DEPARTMENT Provider Note   CSN: [id number] Arrival date & time: 06/03/19  1242     History Chief Complaint  Patient presents with  . Fall    Billy Ponce is a 74 y.o. male.  HPI Patient is 74 year old male with a history of high cholesterol and hypertension and reflux and CAD presented today with sudden onset of left hip, left wrist and left shoulder pain that began immediately after he fell out of his chair earlier this morning.  He states that he has had 2/10 aches in these joints since that happened.  Patient states that he is able to ambulate without difficulty.  He denies any head injury or loss of consciousness.  He denies any bleeding or bruising.  Denies any chest pain shortness breath.  Patient does endorse 6 weeks of ongoing left-sided hip pain that he states he has been evaluated by orthopedic doctors for and treated with steroid injection and physical therapy.  He is still undergoing physical therapy at this time.  He states that the symptoms have been gradually improving until today when he fell.  Denies any known history of osteoporosis    Past Medical History:  Diagnosis Date  . Cancer ([place])   . Coronary artery disease   . GERD (gastroesophageal reflux disease)   . High cholesterol   . Hypertension     There are no problems to display for this patient.   Past Surgical History:  Procedure Laterality Date  . CORONARY ANGIOPLASTY WITH STENT PLACEMENT    . HERNIA REPAIR         No family history on file.  Social History   Tobacco Use  . Smoking status: Never Smoker  . Smokeless tobacco: Never Used  Substance Use Topics  . Alcohol use: No  . Drug use: No    Home Medications Prior to Admission medications   Medication Sig Start Date End Date Taking? Authorizing Provider  aspirin 81 MG tablet Take 81 mg by mouth daily.    [provider]  ezetimibe (ZETIA) 10 MG tablet Take 10 mg daily by mouth.    [provider]  Metoprolol Tartrate (LOPRESSOR PO) Take by mouth.    [provider]  nitroGLYCERIN (NITROSTAT) 0.3 MG SL tablet Place 0.3 mg under the tongue every 5 (five) minutes as needed for chest pain.    [provider]  Omeprazole (PRILOSEC PO) Take by mouth.    [provider]  Rosuvastatin Calcium (CRESTOR PO) Take by mouth.    [provider]    Allergies    Patient has no known allergies.  Review of Systems   Review of Systems  Eyes: Negative for pain.  Respiratory: Negative for cough and shortness of breath.   Cardiovascular: Negative for chest pain and leg swelling.  Gastrointestinal: Negative for abdominal pain and vomiting.  Musculoskeletal: Negative for myalgias.       Left wrist, left hip, left shoulder pain  Skin: Negative for rash.  Neurological: Negative for dizziness and headaches.    Physical Exam Updated Vital Signs BP (!) 157/90 (BP Location: Right Arm)   Pulse 66   Temp 97.6 F (36.4 C) (Oral)   Resp 18   Ht 5\' 10"  (1.778 m)   Wt 90.7 kg   SpO2 99%   BMI 28.70 kg/m   Physical Exam Vitals and nursing note reviewed.  Constitutional:      General: He is not in acute distress. HENT:  Head: Normocephalic and atraumatic.     Nose: Nose normal.     Mouth/Throat:     Mouth: Mucous membranes are moist.  Eyes:     General: No scleral icterus. Cardiovascular:     Rate and Rhythm: Normal rate and regular rhythm.     Pulses: Normal pulses.     Heart sounds: Normal heart sounds.  Pulmonary:     Effort: Pulmonary effort is normal. No respiratory distress.     Breath sounds: No wheezing.  Abdominal:     Palpations: Abdomen is soft.     Tenderness: There is no abdominal tenderness.  Musculoskeletal:     Cervical back: Normal range of motion.     Right lower leg: No edema.     Left lower leg: No edema.     Comments: Strength 5/5 in all upper and lower extremity joints.  Patient is ambulatory.  Good muscle tone  of shoulders and hips/glutes.  Mild tenderness palpation of the left hip.  Mild tenderness to palpation of the left wrist with no snuffbox tenderness.  Full range of motion of hip and wrist.  There is no tenderness palpation of the left shoulder.  Full range of motion of her shoulder.  Skin:    General: Skin is warm and dry.     Capillary Refill: Capillary refill takes less than 2 seconds.  Neurological:     Mental Status: He is alert. Mental status is at baseline.  Psychiatric:        Mood and Affect: Mood normal.        Behavior: Behavior normal.     ED Results / Procedures / Treatments   Labs (all labs ordered are listed, but only abnormal results are displayed) Labs Reviewed - No data to display  EKG None  Radiology DG Wrist Complete Left  Result Date: 06/03/2019 CLINICAL DATA:  Left wrist pain after a fall out of a chair today. Initial encounter. EXAM: LEFT WRIST - COMPLETE 3+ VIEW COMPARISON:  None. FINDINGS: There is no acute bony or joint abnormality. 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 IMPRESSION: No acute finding. Electronically Signed   By: Inge Rise M.D.   On: 06/03/2019 13:52   DG Shoulder Left  Result Date: 06/03/2019 CLINICAL DATA:  Left shoulder pain after a fall out of a chair. Initial encounter. EXAM: LEFT SHOULDER - 2+ VIEW COMPARISON:  None. FINDINGS: There is no evidence of fracture or dislocation. There is no evidence of arthropathy or other focal bone abnormality. Soft tissues are unremarkable. IMPRESSION: Negative exam. Electronically Signed   By: Inge Rise M.D.   On: 06/03/2019 13:53   DG Hips Bilat W or Wo Pelvis 3-4 Views  Result Date: 06/03/2019 CLINICAL DATA:  Bilateral hip pain after the patient fell out of a chair today. Initial encounter. EXAM: DG HIP (WITH OR WITHOUT PELVIS) 3-4V BILAT COMPARISON:  None. FINDINGS: There is no evidence of hip fracture or dislocation. There is no evidence of arthropathy or other  focal bone abnormality. IMPRESSION: Negative exam. Electronically Signed   By: Inge Rise M.D.   On: 06/03/2019 13:51    Procedures Procedures (including critical care time)  Medications Ordered in ED Medications - No data to display  ED Course  I have reviewed the triage vital signs and the nursing notes.  Pertinent labs & imaging results that were available during my care of the patient were reviewed by me and considered in my medical decision making (see chart  for details).    MDM Rules/Calculators/A&P                     Patient is a 74 year old male with history of ongoing left hip pain who fell today and struck his left hip on the ground and sustained a [place] injury of the left arm as well.  He has some left wrist pain as well as left shoulder pain.  Physical exam is unremarkable.  He is not in any acute distress however will obtain x-rays rule out fracture.  Patient's hip, shoulder and wrist x-ray were immediately reviewed by myself.  There is no evidence of fracture.  Patient has no snuffbox tenderness.  No indication for discharge with splint at this time doubt scaphoid fracture.  I discussed this case with my attending physician who cosigned this note including patient's presenting symptoms, physical exam, and planned diagnostics and interventions. Attending physician stated agreement with plan or made changes to plan which were implemented.   Attending physician assessed patient at bedside.  Patient discharged with recommendations to take Tylenol 1000 g every 6 hours.  He will follow-up with his physical therapist that he is already established with.  Final Clinical Impression(s) / ED Diagnoses Final diagnoses:  Fall, initial encounter  Acute pain of left shoulder  Left wrist pain  Left hip pain    Rx / DC Orders ED Discharge Orders    None       Tedd Sias, [place] 06/03/19 1508    Margette Fast, MD 06/05/19 [phone]

## 2019-08-31 ENCOUNTER — Emergency Department (HOSPITAL_BASED_OUTPATIENT_CLINIC_OR_DEPARTMENT_OTHER)
Admission: EM | Admit: 2019-08-31 | Discharge: 2019-08-31 | Disposition: A | Discharge status: Home / Self Care | Payer: Medicare HMO | Attending: Emergency Medicine | Admitting: Emergency Medicine

## 2019-08-31 ENCOUNTER — Other Ambulatory Visit: Payer: Self-pay

## 2019-08-31 ENCOUNTER — Encounter (HOSPITAL_BASED_OUTPATIENT_CLINIC_OR_DEPARTMENT_OTHER): Payer: Self-pay | Admitting: *Deleted

## 2019-08-31 ENCOUNTER — Emergency Department (HOSPITAL_BASED_OUTPATIENT_CLINIC_OR_DEPARTMENT_OTHER): Payer: Medicare HMO

## 2019-08-31 DIAGNOSIS — C801 Malignant (primary) neoplasm, unspecified: Secondary | ICD-10-CM | POA: Diagnosis not present

## 2019-08-31 DIAGNOSIS — R0781 Pleurodynia: Secondary | ICD-10-CM | POA: Diagnosis not present

## 2019-08-31 DIAGNOSIS — I10 Essential (primary) hypertension: Secondary | ICD-10-CM | POA: Diagnosis not present

## 2019-08-31 DIAGNOSIS — I2510 Atherosclerotic heart disease of native coronary artery without angina pectoris: Secondary | ICD-10-CM | POA: Insufficient documentation

## 2019-08-31 DIAGNOSIS — R079 Chest pain, unspecified: Secondary | ICD-10-CM | POA: Diagnosis present

## 2019-08-31 LAB — CBC WITH DIFFERENTIAL/PLATELET
Abs Immature Granulocytes: 0.02 10*3/uL (ref 0.00–0.07)
Basophils Absolute: 0.1 10*3/uL (ref 0.0–0.1)
Basophils Relative: 1 %
Eosinophils Absolute: 0.4 10*3/uL (ref 0.0–0.5)
Eosinophils Relative: 7 %
HCT: 47.2 % (ref 39.0–52.0)
Hemoglobin: 15.7 g/dL (ref 13.0–17.0)
Immature Granulocytes: 0 %
Lymphocytes Relative: 28 %
Lymphs Abs: 1.8 10*3/uL (ref 0.7–4.0)
MCH: 29.8 pg (ref 26.0–34.0)
MCHC: 33.3 g/dL (ref 30.0–36.0)
MCV: 89.6 fL (ref 80.0–100.0)
Monocytes Absolute: 0.7 10*3/uL (ref 0.1–1.0)
Monocytes Relative: 10 %
Neutro Abs: 3.6 10*3/uL (ref 1.7–7.7)
Neutrophils Relative %: 54 %
Platelets: 228 10*3/uL (ref 150–400)
RBC: 5.27 MIL/uL (ref 4.22–5.81)
RDW: 13.8 % (ref 11.5–15.5)
WBC: 6.7 10*3/uL (ref 4.0–10.5)
nRBC: 0 % (ref 0.0–0.2)

## 2019-08-31 LAB — COMPREHENSIVE METABOLIC PANEL
ALT: 21 U/L (ref 0–44)
AST: 25 U/L (ref 15–41)
Albumin: 4 g/dL (ref 3.5–5.0)
Alkaline Phosphatase: 37 U/L — ABNORMAL LOW (ref 38–126)
Anion gap: 12 (ref 5–15)
BUN: 18 mg/dL (ref 8–23)
CO2: 21 mmol/L — ABNORMAL LOW (ref 22–32)
Calcium: 8.8 mg/dL — ABNORMAL LOW (ref 8.9–10.3)
Chloride: 103 mmol/L (ref 98–111)
Creatinine, Ser: 1 mg/dL (ref 0.61–1.24)
GFR calc Af Amer: >60 mL/min (ref >60)
GFR calc non Af Amer: >60 mL/min (ref >60)
Glucose, Bld: 118 mg/dL — ABNORMAL HIGH (ref 70–99)
Potassium: 3.7 mmol/L (ref 3.5–5.1)
Sodium: 136 mmol/L (ref 135–145)
Total Bilirubin: 0.7 mg/dL (ref 0.3–1.2)
Total Protein: 7.5 g/dL (ref 6.5–8.1)

## 2019-08-31 LAB — D-DIMER, QUANTITATIVE: D-Dimer, Quant: 0.39 ug/mL-FEU (ref 0.00–0.50)

## 2019-08-31 LAB — TROPONIN I (HIGH SENSITIVITY)
Troponin I (High Sensitivity): 6 ng/L (ref <18)
Troponin I (High Sensitivity): 7 ng/L (ref <18)

## 2019-08-31 NOTE — ED Triage Notes (Signed)
Pt c/o upper right chest pain and upper back pain x 1 day

## 2019-09-05 NOTE — ED Provider Notes (Signed)
[facility] EMERGENCY DEPARTMENT Provider Note   CSN: [id number] Arrival date & time: 08/31/19  1837     History Chief Complaint  Patient presents with  . Chest Pain    Billy Ponce is a 74 y.o. male.  HPI   74 year old male with chest pain.  Right upper chest in his right upper back.  Onset yesterday.  Pain is sharp.  Worse with deep breathing certain movements.  Feels sharp.  No dyspnea.  No cough.  No fevers or chills.  No unusual leg pain or swelling.  Past Medical History:  Diagnosis Date  . Cancer ([place])   . Coronary artery disease   . GERD (gastroesophageal reflux disease)   . High cholesterol   . Hypertension     There are no problems to display for this patient.   Past Surgical History:  Procedure Laterality Date  . CORONARY ANGIOPLASTY WITH STENT PLACEMENT    . HERNIA REPAIR         History reviewed. No pertinent family history.  Social History   Tobacco Use  . Smoking status: Never Smoker  . Smokeless tobacco: Never Used  Vaping Use  . Vaping Use: Never used  Substance Use Topics  . Alcohol use: No  . Drug use: No    Home Medications Prior to Admission medications   Medication Sig Start Date End Date Taking? Authorizing Provider  aspirin 81 MG tablet Take 81 mg by mouth daily.    [provider]  ezetimibe (ZETIA) 10 MG tablet Take 10 mg daily by mouth.    [provider]  Metoprolol Tartrate (LOPRESSOR PO) Take by mouth.    [provider]  nitroGLYCERIN (NITROSTAT) 0.3 MG SL tablet Place 0.3 mg under the tongue every 5 (five) minutes as needed for chest pain.    [provider]  Omeprazole (PRILOSEC PO) Take by mouth.    [provider]  Rosuvastatin Calcium (CRESTOR PO) Take by mouth.    [provider]    Allergies    Patient has no known allergies.  Review of Systems   Review of Systems All systems reviewed and negative, other than as noted in HPI. Physical  Exam Updated Vital Signs BP 137/90   Pulse 66   Temp 98.1 F (36.7 C)   Resp 17   Ht 5\' 10"  (1.778 m)   Wt 88.5 kg   SpO2 97%   BMI 27.98 kg/m   Physical Exam Vitals and nursing note reviewed.  Constitutional:      General: He is not in acute distress.    Appearance: He is well-developed.  HENT:     Head: Normocephalic and atraumatic.  Eyes:     General:        Right eye: No discharge.        Left eye: No discharge.     Conjunctiva/sclera: Conjunctivae normal.  Cardiovascular:     Rate and Rhythm: Normal rate and regular rhythm.     Heart sounds: Normal heart sounds. No murmur heard.  No friction rub. No gallop.   Pulmonary:     Effort: Pulmonary effort is normal. No respiratory distress.     Breath sounds: Normal breath sounds.  Abdominal:     General: There is no distension.     Palpations: Abdomen is soft.     Tenderness: There is no abdominal tenderness.  Musculoskeletal:        General: No tenderness.     Cervical back:  Neck supple.     Comments: Lower extremities symmetric as compared to each other. No calf tenderness. Negative Homan's. No palpable cords.   Skin:    General: Skin is warm and dry.  Neurological:     Mental Status: He is alert.  Psychiatric:        Behavior: Behavior normal.        Thought Content: Thought content normal.     ED Results / Procedures / Treatments   Labs (all labs ordered are listed, but only abnormal results are displayed) Labs Reviewed  COMPREHENSIVE METABOLIC PANEL - Abnormal; Notable for the following components:      Result Value   CO2 21 (*)    Glucose, Bld 118 (*)    Calcium 8.8 (*)    Alkaline Phosphatase 37 (*)    All other components within normal limits  CBC WITH DIFFERENTIAL/PLATELET  D-DIMER, QUANTITATIVE (NOT AT [facility])  TROPONIN I (HIGH SENSITIVITY)  TROPONIN I (HIGH SENSITIVITY)    EKG EKG Interpretation  Date/Time:  Wednesday August 31 2019 18:41:08 EDT Ventricular Rate:  72 PR  Interval:  154 QRS Duration: 156 QT Interval:  424 QTC Calculation: 464 R Axis:   -53 Text Interpretation: Normal sinus rhythm Right bundle branch block Left anterior fascicular block Bifascicular block  Minimal voltage criteria for LVH, may be normal variant ( R in aVL ) Abnormal ECG Confirmed by Virgel Manifold [phone]) on 08/31/2019 9:06:56 PM   Radiology No results found.  Procedures Procedures (including critical care time)  Medications Ordered in ED Medications - No data to display  ED Course  I have reviewed the triage vital signs and the nursing notes.  Pertinent labs & imaging results that were available during my care of the patient were reviewed by me and considered in my medical decision making (see chart for details).    MDM Rules/Calculators/A&P                          74 year old male with pleuritic sounding chest pain.  Seems atypical for ACS.  Consider PE, but doubt.  No dyspnea.  Not tachycardic.  Not hypoxic.  No signs/symptoms of DVT.  Chest x-ray clear.  Return precautions discussed.  Doubt emergent process at this time though.  Outpatient follow-up as needed.  NSAIDs as needed.  Final Clinical Impression(s) / ED Diagnoses Final diagnoses:  Pleuritic chest pain    Rx / DC Orders ED Discharge Orders    None       Virgel Manifold, MD 09/05/19 [phone]

## 2020-04-01 ENCOUNTER — Emergency Department (HOSPITAL_BASED_OUTPATIENT_CLINIC_OR_DEPARTMENT_OTHER): Payer: Medicare HMO

## 2020-04-01 ENCOUNTER — Emergency Department (HOSPITAL_BASED_OUTPATIENT_CLINIC_OR_DEPARTMENT_OTHER)
Admission: EM | Admit: 2020-04-01 | Discharge: 2020-04-01 | Disposition: A | Discharge status: Home / Self Care | Payer: Medicare HMO | Attending: Emergency Medicine | Admitting: Emergency Medicine

## 2020-04-01 ENCOUNTER — Encounter (HOSPITAL_BASED_OUTPATIENT_CLINIC_OR_DEPARTMENT_OTHER): Payer: Self-pay

## 2020-04-01 ENCOUNTER — Other Ambulatory Visit: Payer: Self-pay

## 2020-04-01 DIAGNOSIS — Z85828 Personal history of other malignant neoplasm of skin: Secondary | ICD-10-CM | POA: Insufficient documentation

## 2020-04-01 DIAGNOSIS — I2510 Atherosclerotic heart disease of native coronary artery without angina pectoris: Secondary | ICD-10-CM | POA: Insufficient documentation

## 2020-04-01 DIAGNOSIS — R0781 Pleurodynia: Secondary | ICD-10-CM

## 2020-04-01 DIAGNOSIS — Z7901 Long term (current) use of anticoagulants: Secondary | ICD-10-CM | POA: Diagnosis not present

## 2020-04-01 DIAGNOSIS — Z79899 Other long term (current) drug therapy: Secondary | ICD-10-CM | POA: Diagnosis not present

## 2020-04-01 DIAGNOSIS — R109 Unspecified abdominal pain: Secondary | ICD-10-CM | POA: Insufficient documentation

## 2020-04-01 DIAGNOSIS — I10 Essential (primary) hypertension: Secondary | ICD-10-CM | POA: Diagnosis not present

## 2020-04-01 DIAGNOSIS — Z7982 Long term (current) use of aspirin: Secondary | ICD-10-CM | POA: Diagnosis not present

## 2020-04-01 DIAGNOSIS — I2699 Other pulmonary embolism without acute cor pulmonale: Secondary | ICD-10-CM | POA: Diagnosis not present

## 2020-04-01 LAB — BASIC METABOLIC PANEL
Anion gap: 10 (ref 5–15)
BUN: 15 mg/dL (ref 8–23)
CO2: 24 mmol/L (ref 22–32)
Calcium: 8.5 mg/dL — ABNORMAL LOW (ref 8.9–10.3)
Chloride: 100 mmol/L (ref 98–111)
Creatinine, Ser: 1.06 mg/dL (ref 0.61–1.24)
GFR, Estimated: >60 mL/min (ref >60)
Glucose, Bld: 116 mg/dL — ABNORMAL HIGH (ref 70–99)
Potassium: 4 mmol/L (ref 3.5–5.1)
Sodium: 134 mmol/L — ABNORMAL LOW (ref 135–145)

## 2020-04-01 LAB — URINALYSIS, ROUTINE W REFLEX MICROSCOPIC
Bilirubin Urine: NEGATIVE
Glucose, UA: NEGATIVE mg/dL
Hgb urine dipstick: NEGATIVE
Ketones, ur: NEGATIVE mg/dL
Leukocytes,Ua: NEGATIVE
Nitrite: NEGATIVE
Protein, ur: NEGATIVE mg/dL
Specific Gravity, Urine: 1.02 (ref 1.005–1.030)
pH: 6 (ref 5.0–8.0)

## 2020-04-01 LAB — CBC WITH DIFFERENTIAL/PLATELET
Abs Immature Granulocytes: 0.04 10*3/uL (ref 0.00–0.07)
Basophils Absolute: 0 10*3/uL (ref 0.0–0.1)
Basophils Relative: 0 %
Eosinophils Absolute: 0.2 10*3/uL (ref 0.0–0.5)
Eosinophils Relative: 2 %
HCT: 44.8 % (ref 39.0–52.0)
Hemoglobin: 15.3 g/dL (ref 13.0–17.0)
Immature Granulocytes: 0 %
Lymphocytes Relative: 13 %
Lymphs Abs: 1.3 10*3/uL (ref 0.7–4.0)
MCH: 30.1 pg (ref 26.0–34.0)
MCHC: 34.2 g/dL (ref 30.0–36.0)
MCV: 88.2 fL (ref 80.0–100.0)
Monocytes Absolute: 1.1 10*3/uL — ABNORMAL HIGH (ref 0.1–1.0)
Monocytes Relative: 11 %
Neutro Abs: 7.8 10*3/uL — ABNORMAL HIGH (ref 1.7–7.7)
Neutrophils Relative %: 74 %
Platelets: 198 10*3/uL (ref 150–400)
RBC: 5.08 MIL/uL (ref 4.22–5.81)
RDW: 14.1 % (ref 11.5–15.5)
WBC: 10.6 10*3/uL — ABNORMAL HIGH (ref 4.0–10.5)
nRBC: 0 % (ref 0.0–0.2)

## 2020-04-01 MED ORDER — RIVAROXABAN 15 MG PO TABS
15.0000 mg | ORAL_TABLET | Freq: Once | ORAL | Status: AC
  Administered 2020-04-01: 15 mg via ORAL
  Filled 2020-04-01: qty 1

## 2020-04-01 MED ORDER — HYDROCODONE-ACETAMINOPHEN 5-325 MG PO TABS: 1.0000 | ORAL_TABLET | Freq: Four times a day (QID) | ORAL | 0 refills | Status: DC | PRN

## 2020-04-01 MED ORDER — HYDROCODONE-ACETAMINOPHEN 5-325 MG PO TABS
1.0000 | ORAL_TABLET | Freq: Once | ORAL | Status: AC
  Administered 2020-04-01: 1 via ORAL
  Filled 2020-04-01: qty 1

## 2020-04-01 MED ORDER — IOHEXOL 350 MG/ML SOLN
100.0000 mL | Freq: Once | INTRAVENOUS | Status: AC | PRN
  Administered 2020-04-01: 100 mL via INTRAVENOUS

## 2020-04-01 MED ORDER — RIVAROXABAN (XARELTO) EDUCATION KIT FOR DVT/PE PATIENTS: PACK | Freq: Once | Status: AC

## 2020-04-01 MED ORDER — RIVAROXABAN (XARELTO) VTE STARTER PACK (15 & 20 MG): ORAL_TABLET | ORAL | 0 refills | Status: DC

## 2020-04-01 MED ORDER — RIVAROXABAN (XARELTO) VTE STARTER PACK (15 & 20 MG): ORAL_TABLET | ORAL | 0 refills | Status: AC

## 2020-04-01 MED ORDER — HYDROCODONE-ACETAMINOPHEN 5-325 MG PO TABS: 1.0000 | ORAL_TABLET | Freq: Four times a day (QID) | ORAL | 0 refills | Status: AC | PRN

## 2020-04-01 NOTE — ED Notes (Signed)
Pt verbalizes understanding of discharge instructions. Opportunity for questioning and answers were provided. Armand removed by staff, pt discharged from ED ambulatory to Home with [facility] as Driver.

## 2020-04-01 NOTE — ED Notes (Signed)
Patient transported to CT 

## 2020-04-01 NOTE — ED Triage Notes (Signed)
Pt Here POV from Home with L. Flank Pain.  Pain Began yesterday afternoon. No recent Trauma or Injury.  No Other Sx besides Pain.

## 2020-04-01 NOTE — ED Provider Notes (Signed)
[facility] DEPT [facility] Provider Note: Georgena Spurling, MD, FACEP  CSN: [id number] MRN: [id number] ARRIVAL: 04/01/20 at [place]: [place]  Flank Pain (Left)   HISTORY OF PRESENT ILLNESS  04/01/20 4:27 AM Billy Ponce is a 75 y.o. male with intermittent left flank pain since yesterday afternoon.  He describes the pain is sharp and rates it currently as a 5 out of 10.  It is worse with certain movements and he has difficulty finding a comfortable position.  He denies associated nausea, vomiting, difficulty urinating or hematuria.  He has taken ibuprofen with some relief.   Past Medical History:  Diagnosis Date  . Cancer ([place])   . Coronary artery disease   . GERD (gastroesophageal reflux disease)   . High cholesterol   . Hypertension     Past Surgical History:  Procedure Laterality Date  . CORONARY ANGIOPLASTY WITH STENT PLACEMENT    . HERNIA REPAIR      No family history on file.  Social History   Tobacco Use  . Smoking status: Never Smoker  . Smokeless tobacco: Never Used  Vaping Use  . Vaping Use: Never used  Substance Use Topics  . Alcohol use: No  . Drug use: No    Prior to Admission medications   Medication Sig Start Date End Date Taking? Authorizing Provider  aspirin 81 MG tablet Take 81 mg by mouth daily.    [provider]  ezetimibe (ZETIA) 10 MG tablet Take 10 mg daily by mouth.    [provider]  HYDROcodone-acetaminophen (NORCO) 5-325 MG tablet Take 1 tablet by mouth every 6 (six) hours as needed for severe pain. 04/01/20   Rakiyah Esch, MD  Metoprolol Tartrate (LOPRESSOR PO) Take by mouth.    [provider]  nitroGLYCERIN (NITROSTAT) 0.3 MG SL tablet Place 0.3 mg under the tongue every 5 (five) minutes as needed for chest pain.    [provider]  Omeprazole (PRILOSEC PO) Take by mouth.    [provider]  RIVAROXABAN Alveda Reasons) VTE STARTER PACK (15 & 20 MG) Follow package directions:  Take one 8m tablet by mouth twice a day. On day 22, switch to one 235mtablet once a day. Take with food. 04/01/20   Chamya Hunton, MD  Rosuvastatin Calcium (CRESTOR PO) Take by mouth.    [provider]    Allergies Patient has no known allergies.   REVIEW OF SYSTEMS  Negative except as noted here or in the History of Present Illness.   PHYSICAL EXAMINATION  Initial Vital Signs Blood pressure 140/83, pulse 74, temperature 97.7 F (36.5 C), temperature source Oral, resp. rate 16, height _0  (1.778 m), weight 90.7 kg, SpO2 92 %.  Examination General: Well-developed, well-nourished male in no acute distress; appearance consistent with age of record HENT: normocephalic; atraumatic Eyes: Normal appearance Neck: supple Heart: regular rate and rhythm Lungs: clear to auscultation bilaterally Abdomen: soft; nondistended; nontender; bowel sounds present GU: No CVA tenderness Extremities: No deformity; full range of motion; pulses normal Neurologic: Awake, alert and oriented; motor function intact in all extremities and symmetric; no facial droop Skin: Warm and dry Psychiatric: Normal mood and affect   RESULTS  Summary of this visit's results, reviewed and interpreted by myself:   EKG Interpretation  Date/Time:    Ventricular Rate:    PR Interval:    QRS Duration:   QT Interval:    QTC Calculation:   R Axis:  Text Interpretation:        Laboratory Studies: Results for orders placed or performed during the hospital encounter of 04/01/20 (from the past 24 hour(s))  Urinalysis, Routine w reflex microscopic Urine, Clean Catch     Status: None   Collection Time: 04/01/20  4:37 AM  Result Value Ref Range   Color, Urine YELLOW YELLOW   APPearance CLEAR CLEAR   Specific Gravity, Urine 1.020 1.005 - 1.030   pH 6.0 5.0 - 8.0   Glucose, UA NEGATIVE NEGATIVE mg/dL   Hgb urine dipstick NEGATIVE NEGATIVE   Bilirubin Urine NEGATIVE NEGATIVE   Ketones, ur NEGATIVE  NEGATIVE mg/dL   Protein, ur NEGATIVE NEGATIVE mg/dL   Nitrite NEGATIVE NEGATIVE   Leukocytes,Ua NEGATIVE NEGATIVE  CBC with Differential/Platelet     Status: Abnormal   Collection Time: 04/01/20  5:15 AM  Result Value Ref Range   WBC 10.6 (H) 4.0 - 10.5 K/uL   RBC 5.08 4.22 - 5.81 MIL/uL   Hemoglobin 15.3 13.0 - 17.0 g/dL   HCT 44.8 39.0 - 52.0 %   MCV 88.2 80.0 - 100.0 fL   MCH 30.1 26.0 - 34.0 pg   MCHC 34.2 30.0 - 36.0 g/dL   RDW 14.1 11.5 - 15.5 %   Platelets 198 150 - 400 K/uL   nRBC 0.0 0.0 - 0.2 %   Neutrophils Relative % 74 %   Neutro Abs 7.8 (H) 1.7 - 7.7 K/uL   Lymphocytes Relative 13 %   Lymphs Abs 1.3 0.7 - 4.0 K/uL   Monocytes Relative 11 %   Monocytes Absolute 1.1 (H) 0.1 - 1.0 K/uL   Eosinophils Relative 2 %   Eosinophils Absolute 0.2 0.0 - 0.5 K/uL   Basophils Relative 0 %   Basophils Absolute 0.0 0.0 - 0.1 K/uL   Immature Granulocytes 0 %   Abs Immature Granulocytes 0.04 0.00 - 0.07 K/uL  Basic metabolic panel     Status: Abnormal   Collection Time: 04/01/20  5:15 AM  Result Value Ref Range   Sodium 134 (L) 135 - 145 mmol/L   Potassium 4.0 3.5 - 5.1 mmol/L   Chloride 100 98 - 111 mmol/L   CO2 24 22 - 32 mmol/L   Glucose, Bld 116 (H) 70 - 99 mg/dL   BUN 15 8 - 23 mg/dL   Creatinine, Ser 1.06 0.61 - 1.24 mg/dL   Calcium 8.5 (L) 8.9 - 10.3 mg/dL   GFR, Estimated >60 >60 mL/min   Anion gap 10 5 - 15   Imaging Studies: CT Angio Chest PE W and/or Wo Contrast  Result Date: 04/01/2020 CLINICAL DATA:  Left flank pain EXAM: CT ANGIOGRAPHY CHEST WITH CONTRAST TECHNIQUE: Multidetector CT imaging of the chest was performed using the standard protocol during bolus administration of intravenous contrast. Multiplanar CT image reconstructions and MIPs were obtained to evaluate the vascular anatomy. CONTRAST:  152m OMNIPAQUE IOHEXOL 350 MG/ML SOLN COMPARISON:  None. FINDINGS: Cardiovascular: Overall normal heart size although subjectively prominent left ventricular  chamber size. No pericardial effusion. Multifocal coronary atherosclerotic calcification. Branching pulmonary artery filling defect in the left lower lobe extending into all of the segmental vessels. No indication of right heart strain. Mediastinum/Nodes: Negative for adenopathy or mass. Small sliding hiatal hernia. Lungs/Pleura: Low volume chest with hazy and streaky opacity in the lower lobes. No definite, discrete infarct. No failure. Small left pleural effusion. Upper Abdomen: Negative Musculoskeletal: No acute or aggressive finding. Generalized degenerative disc narrowing with mild ridging. Critical Value/emergent results  were called by telephone at the time of interpretation on 04/01/2020 at 6:23 am to provider [facility] , who verbally acknowledged these results. Review of the MIP images confirms the above findings. IMPRESSION: 1. Acute lobar and segmental pulmonary embolism to the left lower lobe. 2. Atelectasis at the bases. Small left pleural effusion, likely sympathetic. 3. Aortic Atherosclerosis (ICD10-I70.0).  Coronary atherosclerosis. Electronically Signed   By: Monte Fantasia M.D.   On: 04/01/2020 06:26    ED COURSE and MDM  Nursing notes, initial and subsequent vitals signs, including pulse oximetry, reviewed and interpreted by myself.  Vitals:   04/01/20 0405 04/01/20 0408  BP:  140/83  Pulse:  74  Resp:  16  Temp:  97.7 F (36.5 C)  TempSrc:  Oral  SpO2:  92%  Weight: 90.7 kg   Height: _0  (1.778 m)    Medications  Rivaroxaban (XARELTO) tablet 15 mg (has no administration in time range)  rivaroxaban (XARELTO) Education Kit for DVT/PE patients (has no administration in time range)  HYDROcodone-acetaminophen (NORCO/VICODIN) 5-325 MG per tablet 1 tablet (has no administration in time range)  iohexol (OMNIPAQUE) 350 MG/ML injection 100 mL (100 mLs Intravenous Contrast Given 04/01/20 0607)    4:50 AM Patient forgot to tell me that he had a left lower extremity vein sclerosing  procedure done on his left leg about 2 weeks ago.  He was advised that thromboembolic disease is a known complication.  The patient tells me that the pain in his flank is actually pleuritic in nature, worse with taking a deep breath.  We will obtain a CT angio chest to evaluate for a PE.  6:35 AM CT scan confirms pulmonary embolism without evidence of cor pulmonale.  Will start patient on Xarelto and refer to his cardiologist at [facility].  PROCEDURES  Procedures   ED DIAGNOSES     ICD-10-CM   1. Other acute pulmonary embolism without acute cor pulmonale (HCC)  I26.99   2. Pleuritic chest pain  R07.81        Shanon Rosser, MD 04/01/20 [phone]

## 2021-06-10 IMAGING — DX DG HIP (WITH OR WITHOUT PELVIS) 3-4V BILAT
5 series · 5 of 5 positions shown · non-contrast
Comparison: None.

CLINICAL DATA: Bilateral hip pain after the patient fell out of a
chair today. Initial encounter.

EXAM:
DG HIP (WITH OR WITHOUT PELVIS) 3-4V BILAT

[pelvis ap]
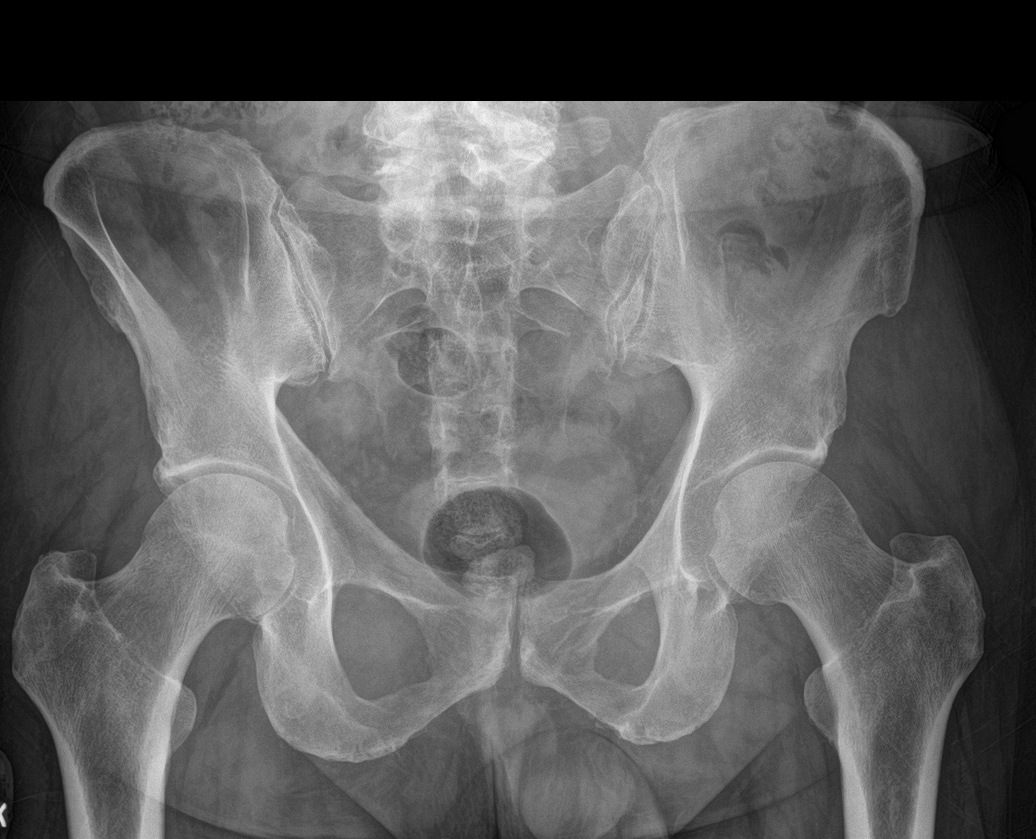

[hip ap (1 of 2)]
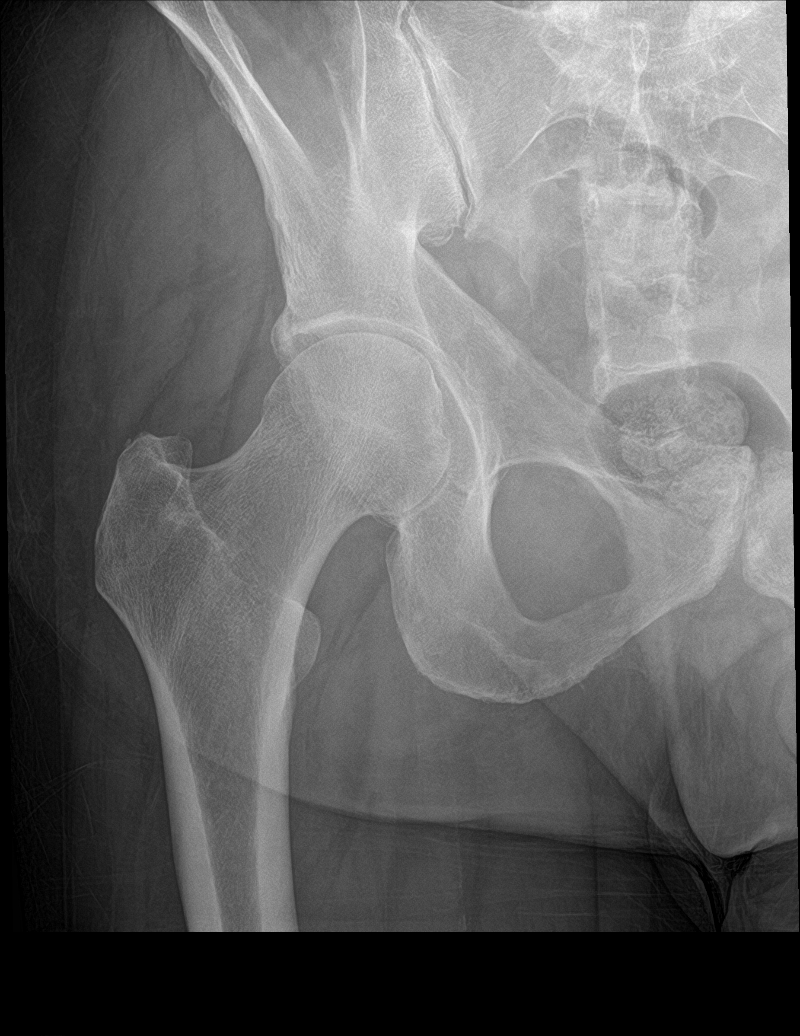

[hip ap (2 of 2)]
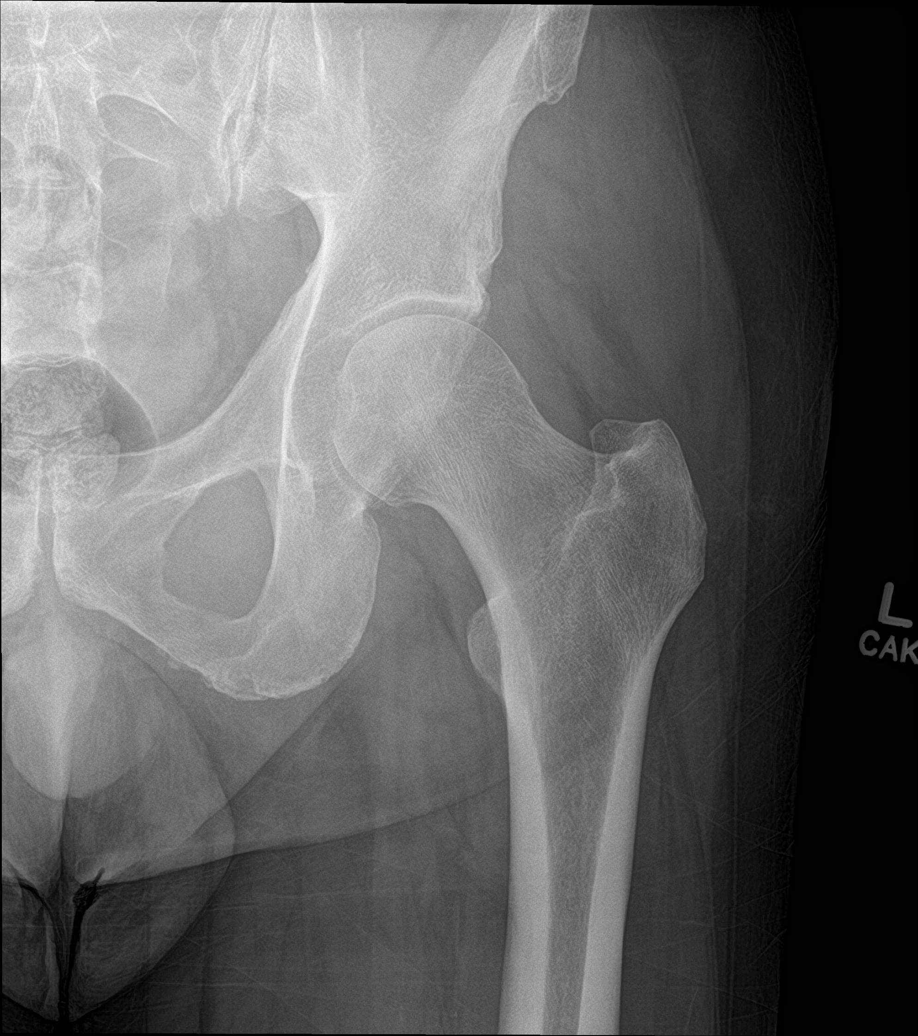

[hip lat (1 of 2)]
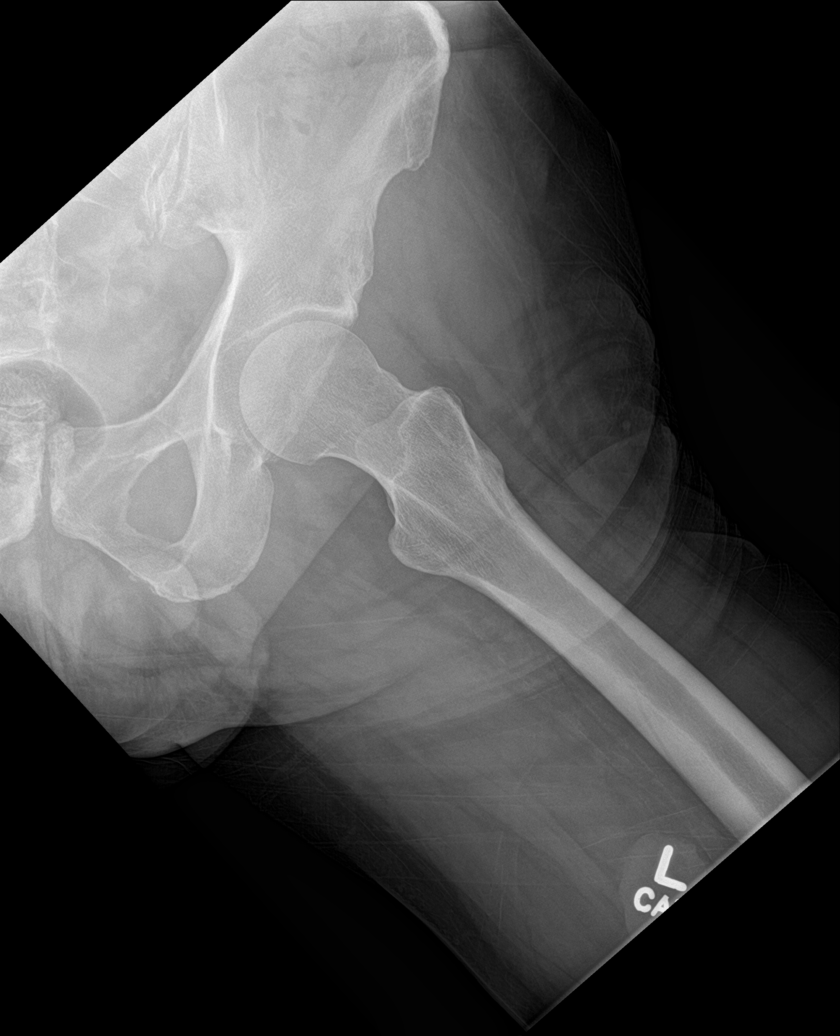

[hip lat (2 of 2)]
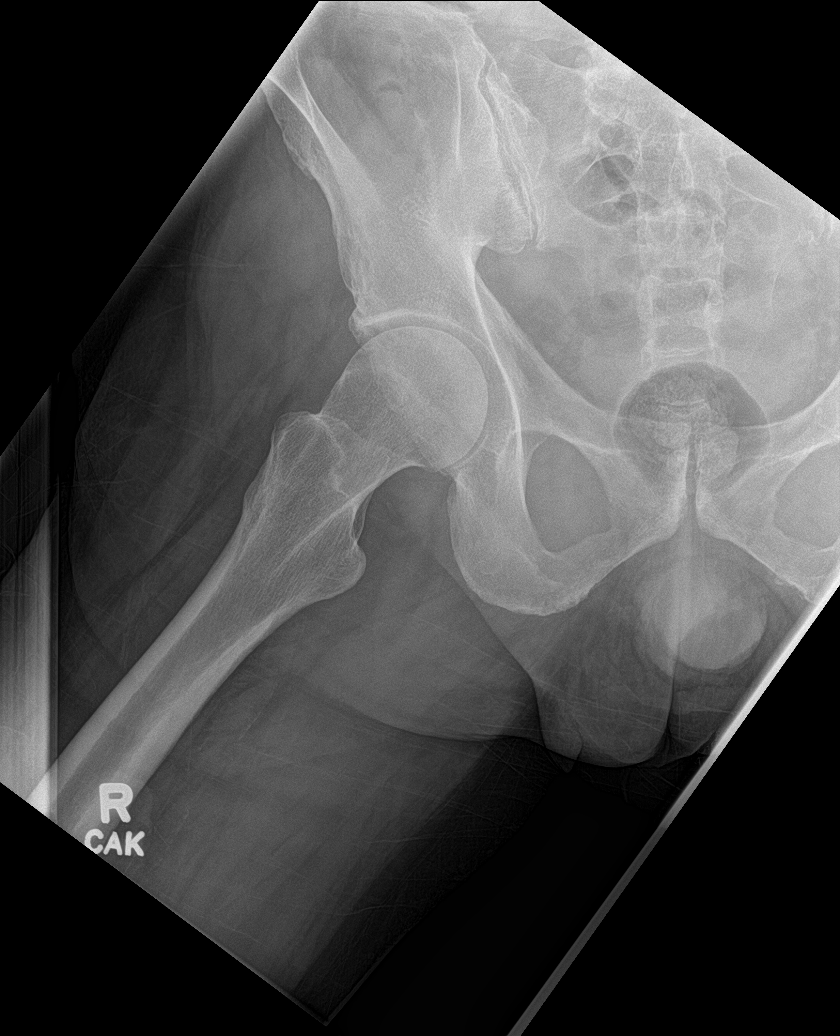

[5 of 5 positions shown; findings below may reference images not displayed]

FINDINGS: There is no evidence of hip fracture or dislocation. There is no
evidence of arthropathy or other focal bone abnormality.
IMPRESSION: Negative exam.

## 2021-06-10 IMAGING — DX DG WRIST COMPLETE 3+V*L*
4 series · 4 of 4 positions shown · non-contrast
Comparison: None.

CLINICAL DATA: Left wrist pain after a fall out of a chair today.
Initial encounter.

EXAM:
LEFT WRIST - COMPLETE 3+ VIEW

[wrist pa]
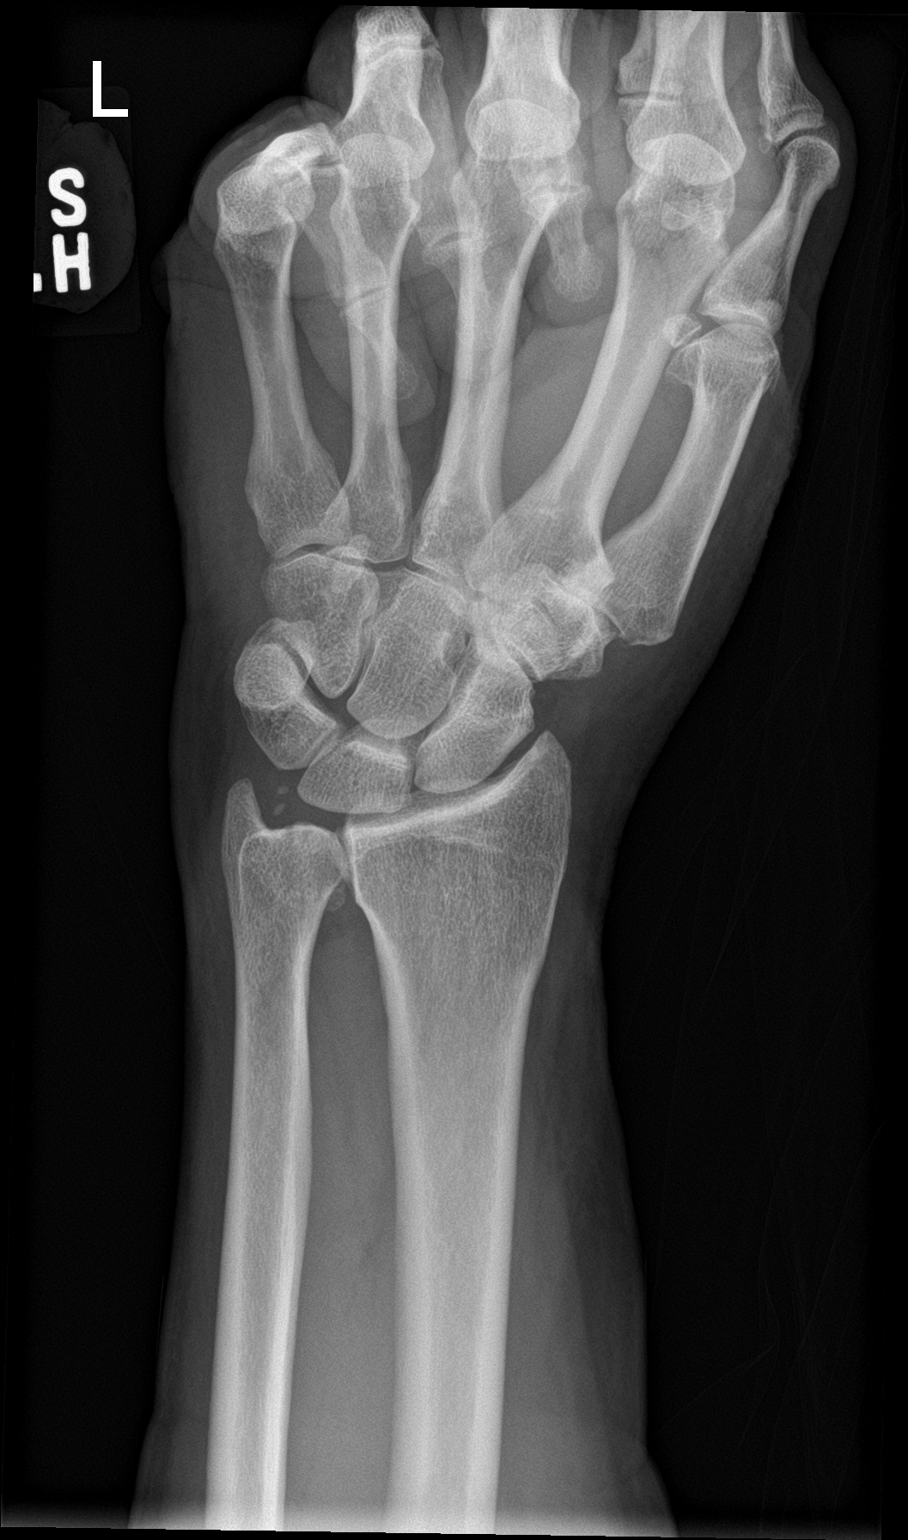

[wrist obl]
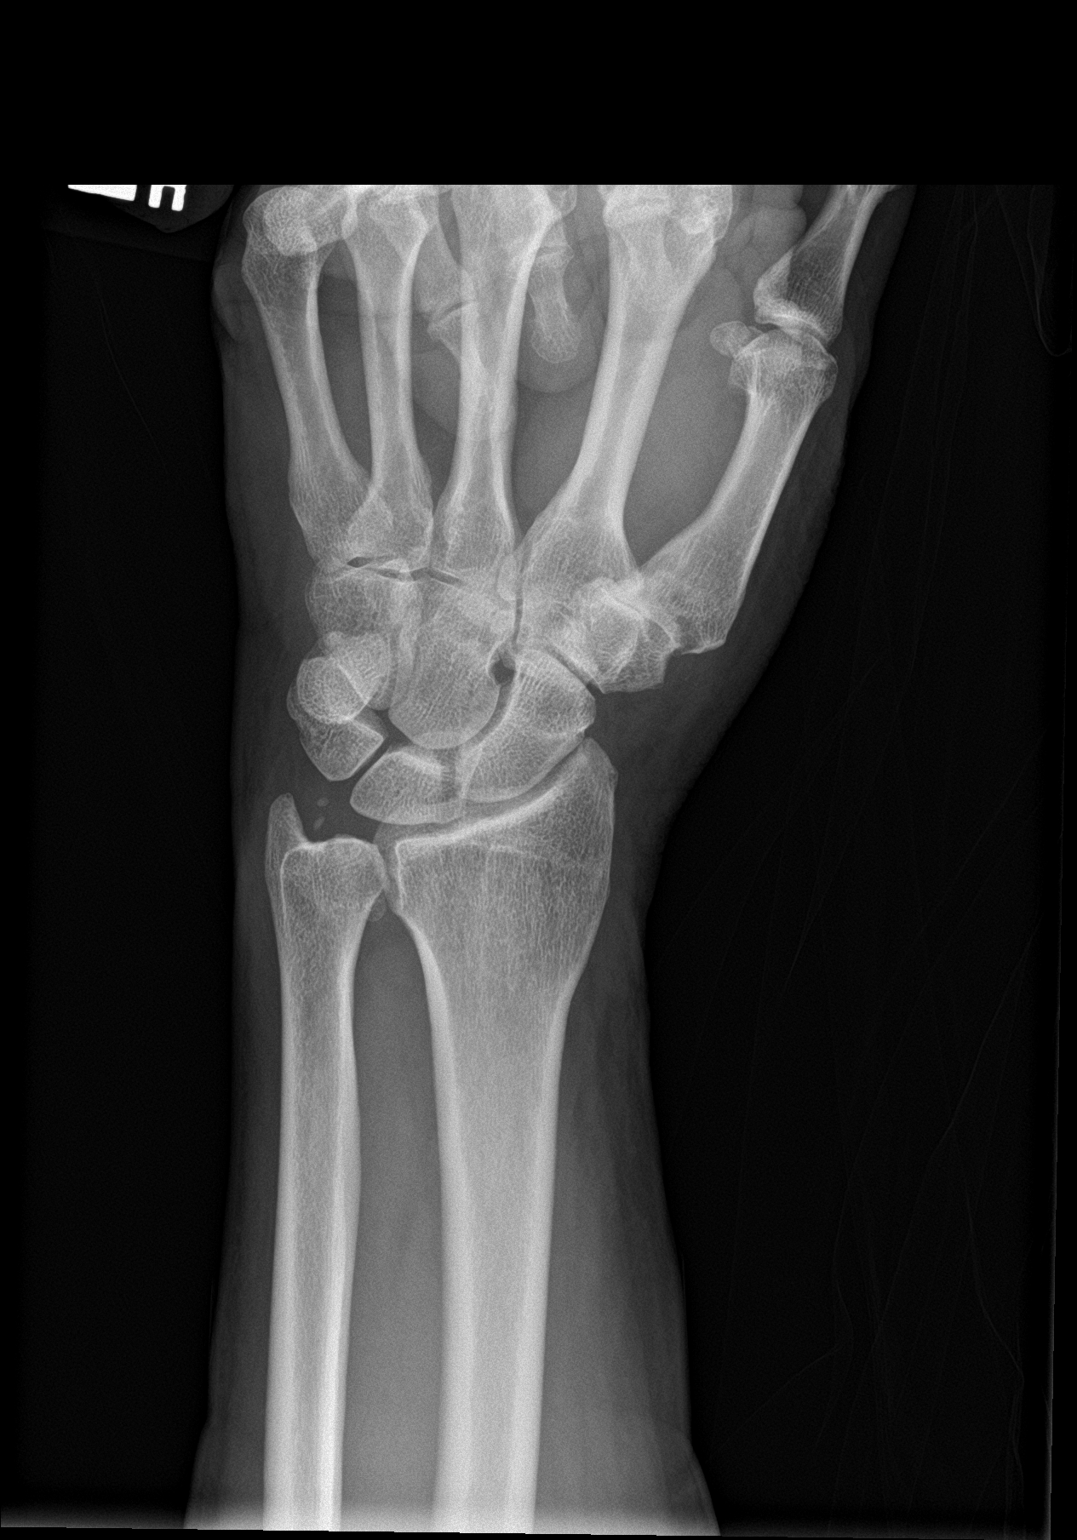

[wrist lat]
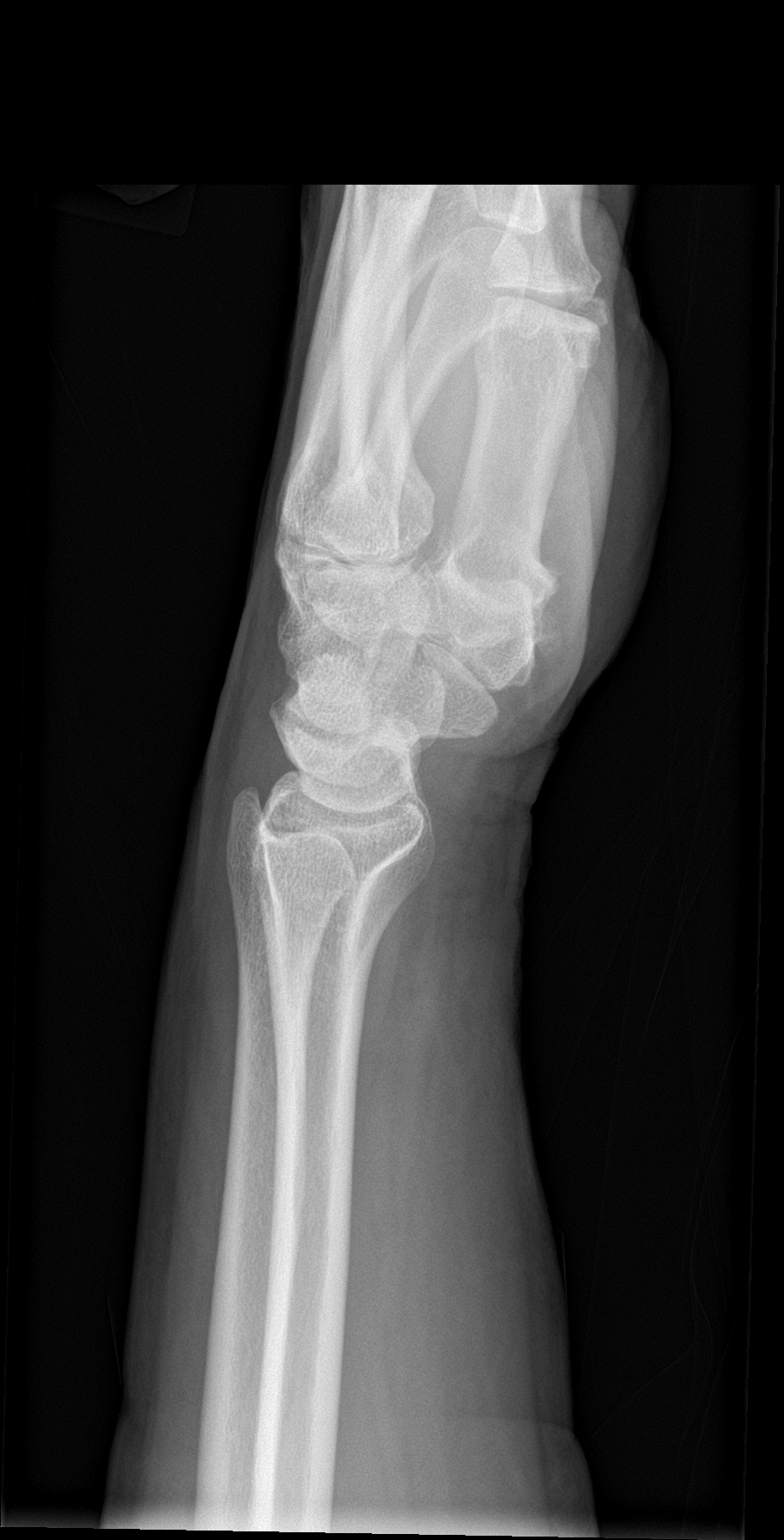

[wrist navicular]
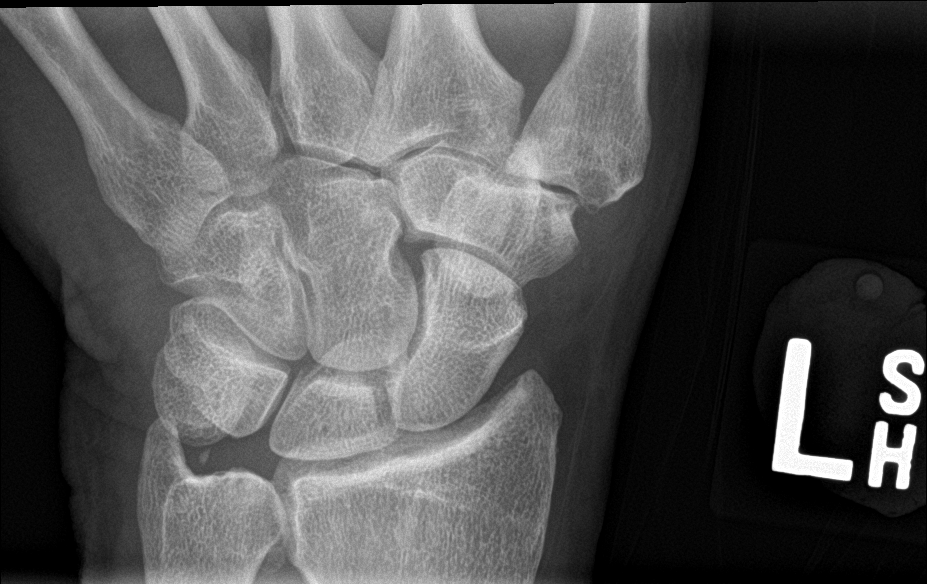

[4 of 4 positions shown; findings below may reference images not displayed]

FINDINGS: There is no acute bony or joint abnormality. Mild first CMC
osteoarthritis and TFC chondrocalcinosis noted.
IMPRESSION: No acute finding.

## 2021-06-10 IMAGING — DX DG SHOULDER 2+V*L*
2 series · 2 of 2 positions shown · non-contrast
Comparison: None.

CLINICAL DATA: Left shoulder pain after a fall out of a chair.
Initial encounter.

EXAM:
LEFT SHOULDER - 2+ VIEW

[shoulder grashey]
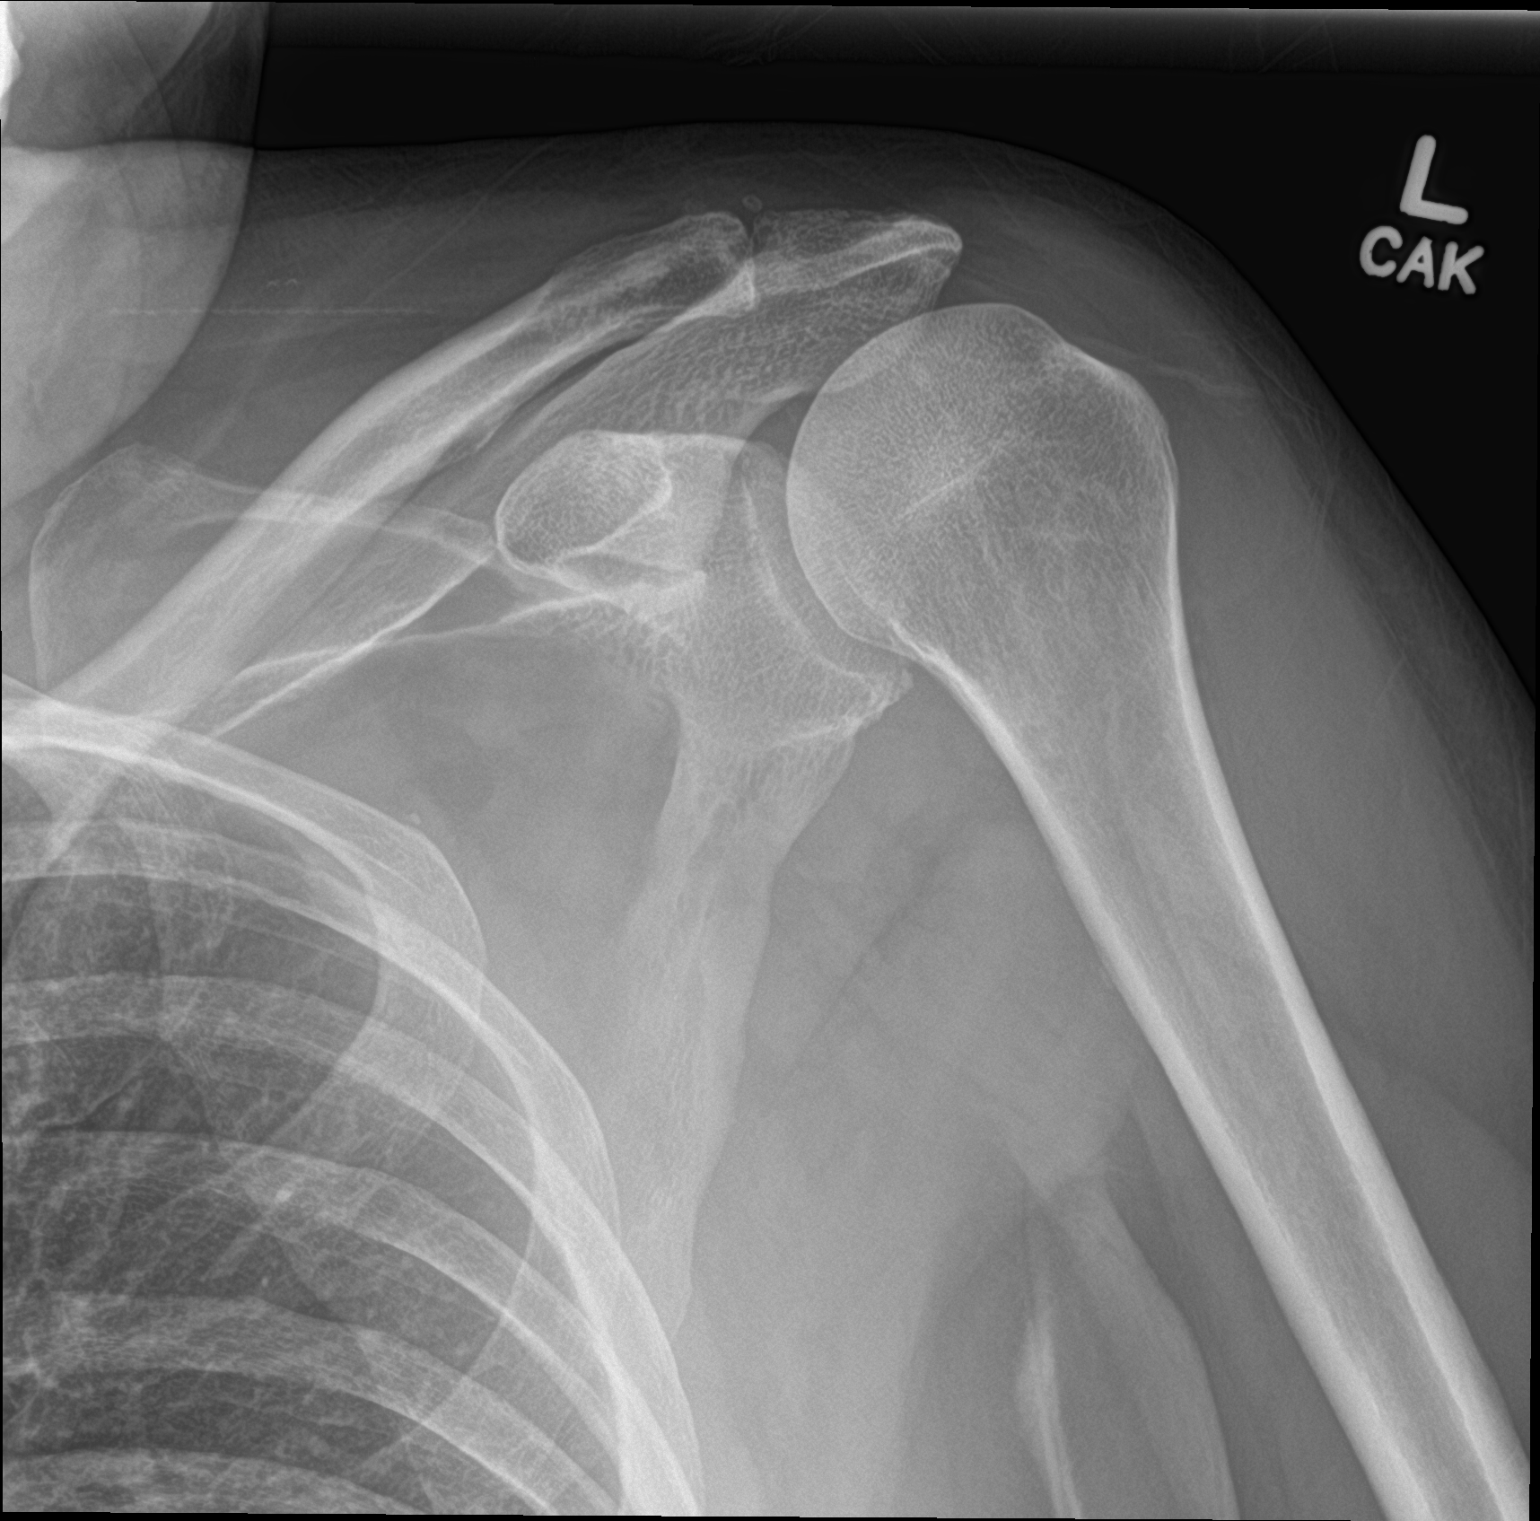

[shoulder y view]
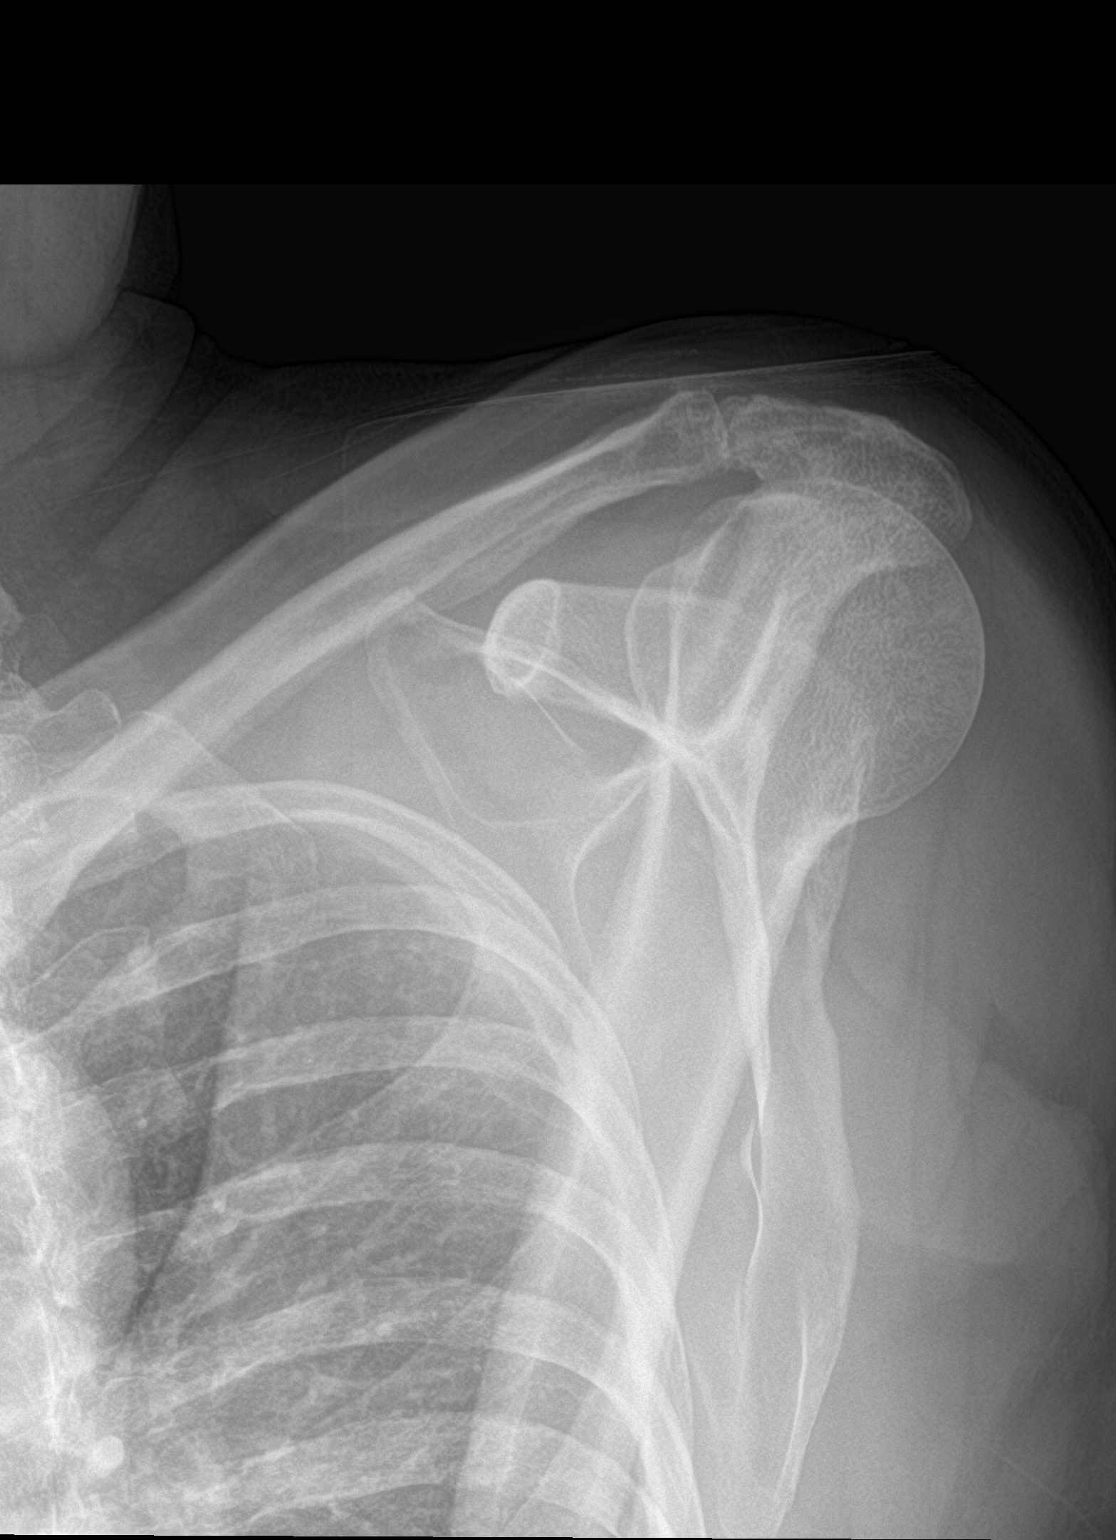

[2 of 2 positions shown; findings below may reference images not displayed]

FINDINGS: There is no evidence of fracture or dislocation. There is no
evidence of arthropathy or other focal bone abnormality. Soft
tissues are unremarkable.
IMPRESSION: Negative exam.

## 2022-04-25 DIAGNOSIS — Z79899 Other long term (current) drug therapy: Secondary | ICD-10-CM | POA: Diagnosis not present

## 2022-04-25 DIAGNOSIS — I5022 Chronic systolic (congestive) heart failure: Secondary | ICD-10-CM | POA: Diagnosis not present

## 2022-05-07 DIAGNOSIS — R310 Gross hematuria: Secondary | ICD-10-CM | POA: Diagnosis not present

## 2022-05-07 DIAGNOSIS — R972 Elevated prostate specific antigen [PSA]: Secondary | ICD-10-CM | POA: Diagnosis not present

## 2022-05-07 DIAGNOSIS — R82998 Other abnormal findings in urine: Secondary | ICD-10-CM | POA: Diagnosis not present

## 2022-05-19 DIAGNOSIS — L570 Actinic keratosis: Secondary | ICD-10-CM | POA: Diagnosis not present

## 2022-05-19 DIAGNOSIS — L820 Inflamed seborrheic keratosis: Secondary | ICD-10-CM | POA: Diagnosis not present

## 2022-05-20 DIAGNOSIS — R0789 Other chest pain: Secondary | ICD-10-CM | POA: Diagnosis not present

## 2022-05-20 DIAGNOSIS — I480 Paroxysmal atrial fibrillation: Secondary | ICD-10-CM | POA: Diagnosis not present

## 2022-05-20 DIAGNOSIS — I2510 Atherosclerotic heart disease of native coronary artery without angina pectoris: Secondary | ICD-10-CM | POA: Diagnosis not present

## 2022-05-20 DIAGNOSIS — I5022 Chronic systolic (congestive) heart failure: Secondary | ICD-10-CM | POA: Diagnosis not present

## 2022-05-20 DIAGNOSIS — Z9861 Coronary angioplasty status: Secondary | ICD-10-CM | POA: Diagnosis not present

## 2022-05-20 DIAGNOSIS — R0609 Other forms of dyspnea: Secondary | ICD-10-CM | POA: Diagnosis not present

## 2022-05-20 DIAGNOSIS — I5020 Unspecified systolic (congestive) heart failure: Secondary | ICD-10-CM | POA: Diagnosis not present

## 2022-05-20 DIAGNOSIS — E032 Hypothyroidism due to medicaments and other exogenous substances: Secondary | ICD-10-CM | POA: Diagnosis not present

## 2022-05-30 DIAGNOSIS — R0609 Other forms of dyspnea: Secondary | ICD-10-CM | POA: Diagnosis not present

## 2022-05-31 DIAGNOSIS — I25119 Atherosclerotic heart disease of native coronary artery with unspecified angina pectoris: Secondary | ICD-10-CM | POA: Diagnosis not present

## 2022-05-31 DIAGNOSIS — R9431 Abnormal electrocardiogram [ECG] [EKG]: Secondary | ICD-10-CM | POA: Diagnosis not present

## 2022-05-31 DIAGNOSIS — N179 Acute kidney failure, unspecified: Secondary | ICD-10-CM | POA: Diagnosis not present

## 2022-05-31 DIAGNOSIS — I2510 Atherosclerotic heart disease of native coronary artery without angina pectoris: Secondary | ICD-10-CM | POA: Diagnosis not present

## 2022-05-31 DIAGNOSIS — I452 Bifascicular block: Secondary | ICD-10-CM | POA: Diagnosis not present

## 2022-05-31 DIAGNOSIS — R079 Chest pain, unspecified: Secondary | ICD-10-CM | POA: Diagnosis not present

## 2022-06-10 DIAGNOSIS — Z789 Other specified health status: Secondary | ICD-10-CM | POA: Diagnosis not present

## 2022-06-10 DIAGNOSIS — I2510 Atherosclerotic heart disease of native coronary artery without angina pectoris: Secondary | ICD-10-CM | POA: Diagnosis not present

## 2022-06-10 DIAGNOSIS — I480 Paroxysmal atrial fibrillation: Secondary | ICD-10-CM | POA: Diagnosis not present

## 2022-06-10 DIAGNOSIS — I5020 Unspecified systolic (congestive) heart failure: Secondary | ICD-10-CM | POA: Diagnosis not present

## 2022-06-10 DIAGNOSIS — Z9861 Coronary angioplasty status: Secondary | ICD-10-CM | POA: Diagnosis not present

## 2022-06-10 DIAGNOSIS — E7800 Pure hypercholesterolemia, unspecified: Secondary | ICD-10-CM | POA: Diagnosis not present

## 2022-06-19 DIAGNOSIS — I5022 Chronic systolic (congestive) heart failure: Secondary | ICD-10-CM | POA: Diagnosis not present

## 2022-06-19 DIAGNOSIS — I5020 Unspecified systolic (congestive) heart failure: Secondary | ICD-10-CM | POA: Diagnosis not present

## 2022-06-19 DIAGNOSIS — Z9861 Coronary angioplasty status: Secondary | ICD-10-CM | POA: Diagnosis not present

## 2022-06-19 DIAGNOSIS — Z955 Presence of coronary angioplasty implant and graft: Secondary | ICD-10-CM | POA: Diagnosis not present

## 2022-06-19 DIAGNOSIS — T82855A Stenosis of coronary artery stent, initial encounter: Secondary | ICD-10-CM | POA: Diagnosis not present

## 2022-06-19 DIAGNOSIS — I480 Paroxysmal atrial fibrillation: Secondary | ICD-10-CM | POA: Diagnosis not present

## 2022-06-19 DIAGNOSIS — Z7901 Long term (current) use of anticoagulants: Secondary | ICD-10-CM | POA: Diagnosis not present

## 2022-06-19 DIAGNOSIS — E7800 Pure hypercholesterolemia, unspecified: Secondary | ICD-10-CM | POA: Diagnosis not present

## 2022-06-19 DIAGNOSIS — Z79899 Other long term (current) drug therapy: Secondary | ICD-10-CM | POA: Diagnosis not present

## 2022-06-19 DIAGNOSIS — I2510 Atherosclerotic heart disease of native coronary artery without angina pectoris: Secondary | ICD-10-CM | POA: Diagnosis not present

## 2022-06-19 DIAGNOSIS — R6889 Other general symptoms and signs: Secondary | ICD-10-CM | POA: Diagnosis not present

## 2022-06-20 DIAGNOSIS — I480 Paroxysmal atrial fibrillation: Secondary | ICD-10-CM | POA: Diagnosis not present

## 2022-06-20 DIAGNOSIS — Z8673 Personal history of transient ischemic attack (TIA), and cerebral infarction without residual deficits: Secondary | ICD-10-CM | POA: Diagnosis not present

## 2022-06-20 DIAGNOSIS — I2510 Atherosclerotic heart disease of native coronary artery without angina pectoris: Secondary | ICD-10-CM | POA: Diagnosis not present

## 2022-06-20 DIAGNOSIS — Z86711 Personal history of pulmonary embolism: Secondary | ICD-10-CM | POA: Diagnosis not present

## 2022-06-20 DIAGNOSIS — Z955 Presence of coronary angioplasty implant and graft: Secondary | ICD-10-CM | POA: Diagnosis not present

## 2022-06-20 DIAGNOSIS — E7800 Pure hypercholesterolemia, unspecified: Secondary | ICD-10-CM | POA: Diagnosis not present

## 2022-06-20 DIAGNOSIS — I5020 Unspecified systolic (congestive) heart failure: Secondary | ICD-10-CM | POA: Diagnosis not present

## 2022-06-20 DIAGNOSIS — I452 Bifascicular block: Secondary | ICD-10-CM | POA: Diagnosis not present

## 2022-07-02 DIAGNOSIS — I5020 Unspecified systolic (congestive) heart failure: Secondary | ICD-10-CM | POA: Diagnosis not present

## 2022-07-02 DIAGNOSIS — Z86711 Personal history of pulmonary embolism: Secondary | ICD-10-CM | POA: Diagnosis not present

## 2022-07-02 DIAGNOSIS — I480 Paroxysmal atrial fibrillation: Secondary | ICD-10-CM | POA: Diagnosis not present

## 2022-07-02 DIAGNOSIS — E7800 Pure hypercholesterolemia, unspecified: Secondary | ICD-10-CM | POA: Diagnosis not present

## 2022-07-02 DIAGNOSIS — I2510 Atherosclerotic heart disease of native coronary artery without angina pectoris: Secondary | ICD-10-CM | POA: Diagnosis not present

## 2022-07-08 DIAGNOSIS — E032 Hypothyroidism due to medicaments and other exogenous substances: Secondary | ICD-10-CM | POA: Diagnosis not present

## 2022-08-04 DIAGNOSIS — S8011XA Contusion of right lower leg, initial encounter: Secondary | ICD-10-CM | POA: Diagnosis not present

## 2022-08-04 DIAGNOSIS — I4891 Unspecified atrial fibrillation: Secondary | ICD-10-CM | POA: Diagnosis not present

## 2022-08-04 DIAGNOSIS — Z955 Presence of coronary angioplasty implant and graft: Secondary | ICD-10-CM | POA: Diagnosis not present

## 2022-08-04 DIAGNOSIS — I2510 Atherosclerotic heart disease of native coronary artery without angina pectoris: Secondary | ICD-10-CM | POA: Diagnosis not present

## 2022-08-04 DIAGNOSIS — E785 Hyperlipidemia, unspecified: Secondary | ICD-10-CM | POA: Diagnosis not present

## 2022-08-04 DIAGNOSIS — Z86711 Personal history of pulmonary embolism: Secondary | ICD-10-CM | POA: Diagnosis not present

## 2022-08-04 DIAGNOSIS — K219 Gastro-esophageal reflux disease without esophagitis: Secondary | ICD-10-CM | POA: Diagnosis not present

## 2022-08-04 DIAGNOSIS — Z86718 Personal history of other venous thrombosis and embolism: Secondary | ICD-10-CM | POA: Diagnosis not present

## 2022-08-04 DIAGNOSIS — M7121 Synovial cyst of popliteal space [Baker], right knee: Secondary | ICD-10-CM | POA: Diagnosis not present

## 2022-08-04 DIAGNOSIS — Z7901 Long term (current) use of anticoagulants: Secondary | ICD-10-CM | POA: Diagnosis not present

## 2022-08-04 DIAGNOSIS — I10 Essential (primary) hypertension: Secondary | ICD-10-CM | POA: Diagnosis not present

## 2022-08-11 DIAGNOSIS — M79661 Pain in right lower leg: Secondary | ICD-10-CM | POA: Diagnosis not present

## 2022-09-03 DIAGNOSIS — R21 Rash and other nonspecific skin eruption: Secondary | ICD-10-CM | POA: Diagnosis not present

## 2022-12-12 DIAGNOSIS — R7989 Other specified abnormal findings of blood chemistry: Secondary | ICD-10-CM | POA: Diagnosis not present

## 2022-12-12 DIAGNOSIS — E7800 Pure hypercholesterolemia, unspecified: Secondary | ICD-10-CM | POA: Diagnosis not present

## 2022-12-12 DIAGNOSIS — I5022 Chronic systolic (congestive) heart failure: Secondary | ICD-10-CM | POA: Diagnosis not present

## 2022-12-12 DIAGNOSIS — I2510 Atherosclerotic heart disease of native coronary artery without angina pectoris: Secondary | ICD-10-CM | POA: Diagnosis not present

## 2022-12-12 DIAGNOSIS — R972 Elevated prostate specific antigen [PSA]: Secondary | ICD-10-CM | POA: Diagnosis not present

## 2022-12-30 DIAGNOSIS — I351 Nonrheumatic aortic (valve) insufficiency: Secondary | ICD-10-CM | POA: Diagnosis not present

## 2022-12-30 DIAGNOSIS — I358 Other nonrheumatic aortic valve disorders: Secondary | ICD-10-CM | POA: Diagnosis not present
